# Patient Record
Sex: Female | Born: 1980 | Race: Black or African American | Hispanic: No | Marital: Single | Smoking: Former smoker
Health system: Southern US, Community
[De-identification: ages and names within clinical notes are randomized; demographics above are authoritative.]

## PROBLEM LIST (undated history)

## (undated) DIAGNOSIS — E282 Polycystic ovarian syndrome: Secondary | ICD-10-CM

## (undated) DIAGNOSIS — G43909 Migraine, unspecified, not intractable, without status migrainosus: Secondary | ICD-10-CM

## (undated) DIAGNOSIS — E119 Type 2 diabetes mellitus without complications: Secondary | ICD-10-CM

## (undated) DIAGNOSIS — G2581 Restless legs syndrome: Secondary | ICD-10-CM

## (undated) HISTORY — DX: Restless legs syndrome: G25.81

## (undated) HISTORY — DX: Type 2 diabetes mellitus without complications: E11.9

## (undated) HISTORY — PX: TONSILLECTOMY: SUR1361

## (undated) HISTORY — DX: Migraine, unspecified, not intractable, without status migrainosus: G43.909

---

## 2000-11-09 ENCOUNTER — Emergency Department (HOSPITAL_COMMUNITY): Admission: EM | Admit: 2000-11-09 | Discharge: 2000-11-09 | Payer: Self-pay | Admitting: Emergency Medicine

## 2001-03-13 ENCOUNTER — Other Ambulatory Visit: Admission: RE | Admit: 2001-03-13 | Discharge: 2001-03-13 | Payer: Self-pay | Admitting: Gynecology

## 2001-03-16 ENCOUNTER — Inpatient Hospital Stay (HOSPITAL_COMMUNITY): Admission: AD | Admit: 2001-03-16 | Discharge: 2001-03-16 | Payer: Self-pay | Admitting: Gynecology

## 2001-10-29 ENCOUNTER — Emergency Department (HOSPITAL_COMMUNITY): Admission: EM | Admit: 2001-10-29 | Discharge: 2001-10-30 | Payer: Self-pay | Admitting: Emergency Medicine

## 2002-04-12 ENCOUNTER — Emergency Department (HOSPITAL_COMMUNITY): Admission: EM | Admit: 2002-04-12 | Discharge: 2002-04-12 | Payer: Self-pay | Admitting: Emergency Medicine

## 2002-08-15 ENCOUNTER — Emergency Department (HOSPITAL_COMMUNITY): Admission: EM | Admit: 2002-08-15 | Discharge: 2002-08-15 | Payer: Self-pay | Admitting: Emergency Medicine

## 2002-11-19 ENCOUNTER — Emergency Department (HOSPITAL_COMMUNITY): Admission: EM | Admit: 2002-11-19 | Discharge: 2002-11-19 | Payer: Self-pay | Admitting: Emergency Medicine

## 2002-11-19 ENCOUNTER — Encounter: Payer: Self-pay | Admitting: Emergency Medicine

## 2003-03-12 ENCOUNTER — Inpatient Hospital Stay (HOSPITAL_COMMUNITY): Admission: AD | Admit: 2003-03-12 | Discharge: 2003-03-12 | Payer: Self-pay | Admitting: *Deleted

## 2003-03-19 ENCOUNTER — Emergency Department (HOSPITAL_COMMUNITY): Admission: EM | Admit: 2003-03-19 | Discharge: 2003-03-19 | Payer: Self-pay | Admitting: Emergency Medicine

## 2003-05-05 ENCOUNTER — Emergency Department (HOSPITAL_COMMUNITY): Admission: EM | Admit: 2003-05-05 | Discharge: 2003-05-05 | Payer: Self-pay | Admitting: Emergency Medicine

## 2004-12-22 ENCOUNTER — Other Ambulatory Visit: Admission: RE | Admit: 2004-12-22 | Discharge: 2004-12-22 | Payer: Self-pay | Admitting: Obstetrics and Gynecology

## 2005-06-13 ENCOUNTER — Inpatient Hospital Stay (HOSPITAL_COMMUNITY): Admission: AD | Admit: 2005-06-13 | Discharge: 2005-06-13 | Payer: Self-pay | Admitting: Obstetrics and Gynecology

## 2007-01-19 ENCOUNTER — Encounter: Admission: RE | Admit: 2007-01-19 | Discharge: 2007-01-19 | Payer: Self-pay | Admitting: Internal Medicine

## 2008-12-03 IMAGING — US US TRANSVAGINAL NON-OB
1 series · 14 of 25 positions shown · non-contrast
Comparison: None.

CLINICAL DATA: Irregular menstrual cycle since puberty.  
 TRANSABDOMINAL AND TRANSVAGINAL PELVIC ULTRASOUND:
TECHNIQUE: Both transabdominal and transvaginal ultrasound examinations of the pelvis were performed including evaluation of the uterus, ovaries, adnexal regions, and pelvic cul-de-sac.

[Series 1: unknown · 0.13mm/px · 14 of 35 slices shown]
[im 1/35]
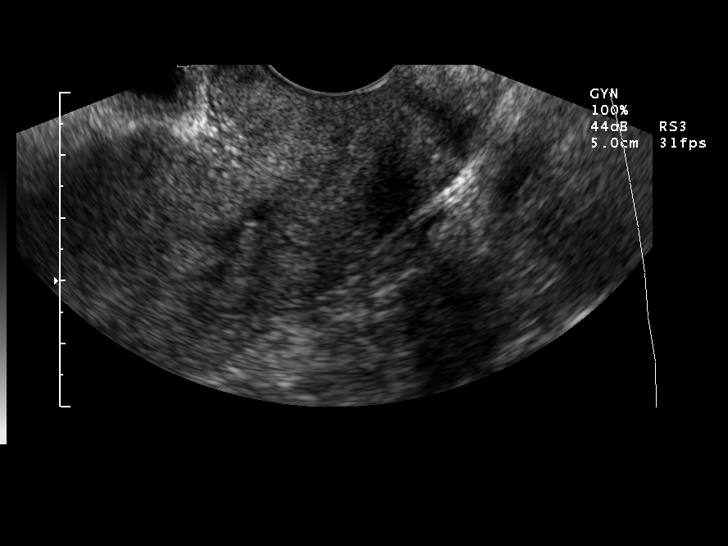
[im 3/35]
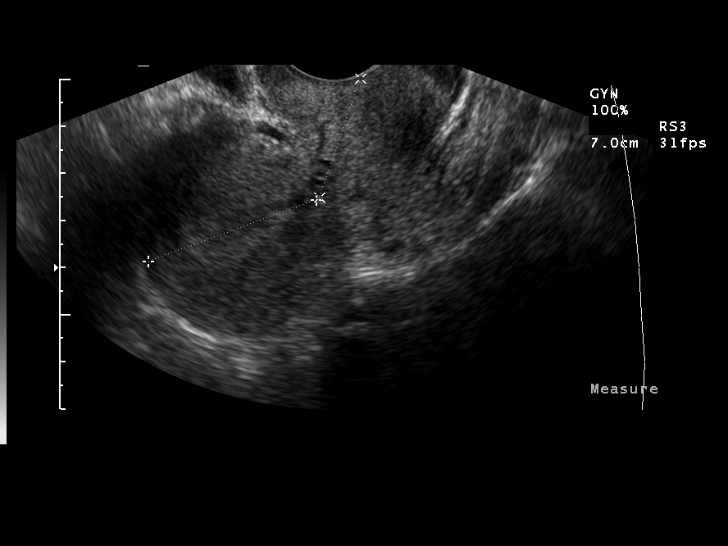
[im 6/35]
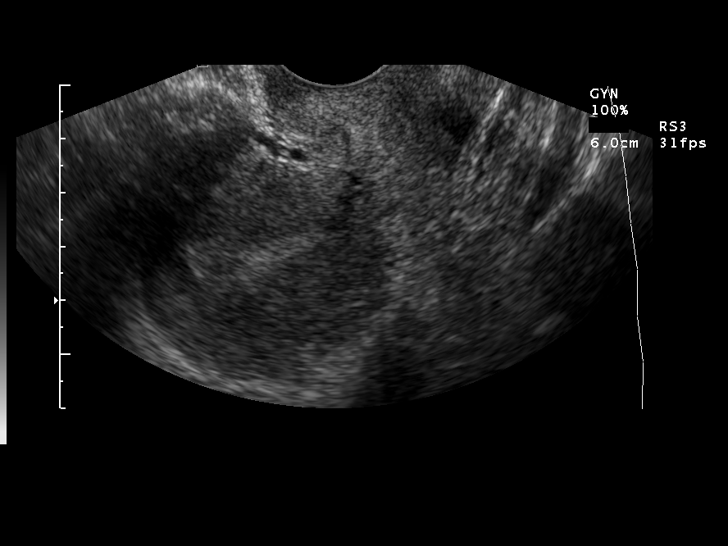
[im 9/35]
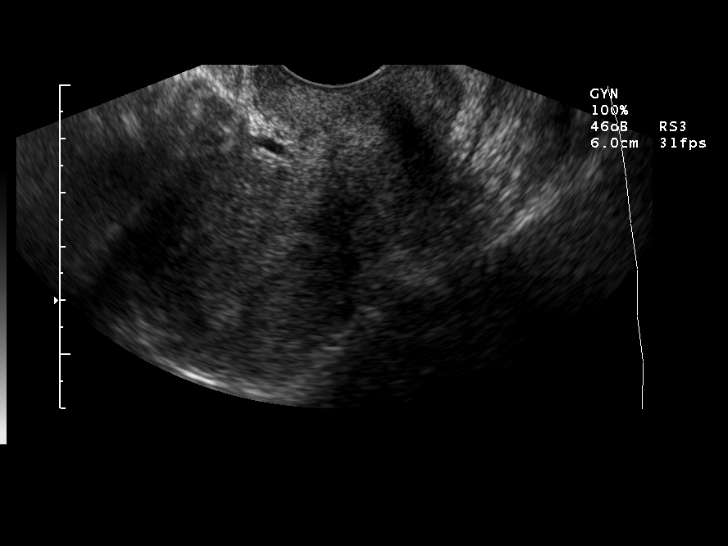
[im 12/35]
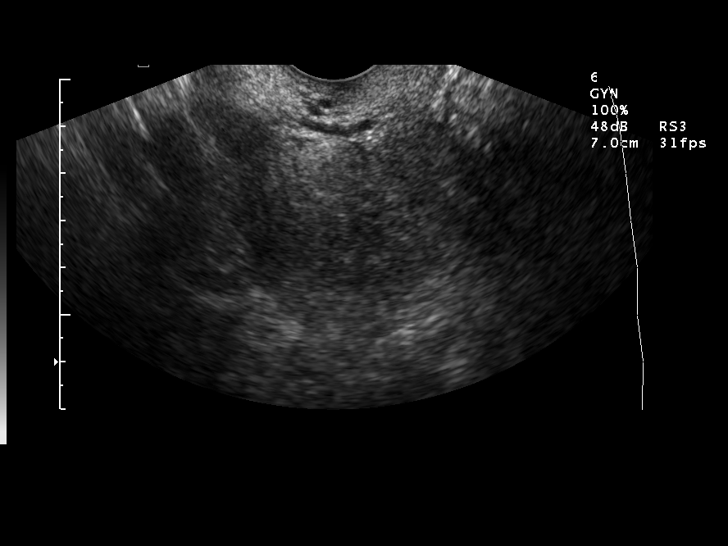
[im 13/35]
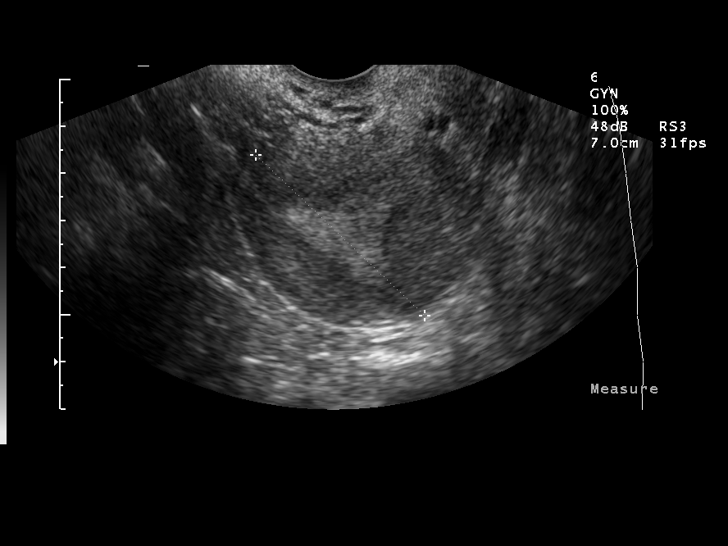
[im 16/35]
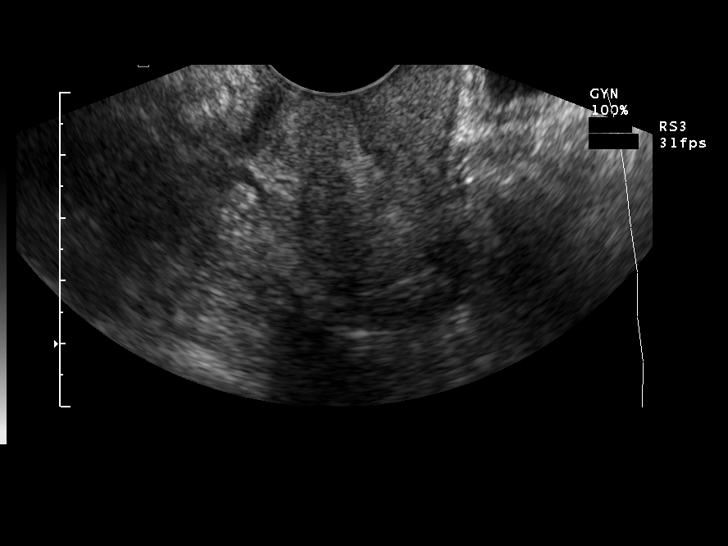
[im 19/35]
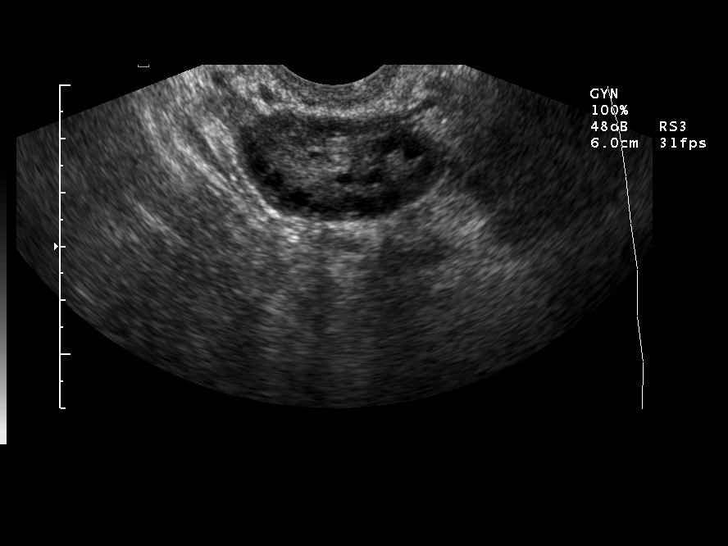
[im 22/35]
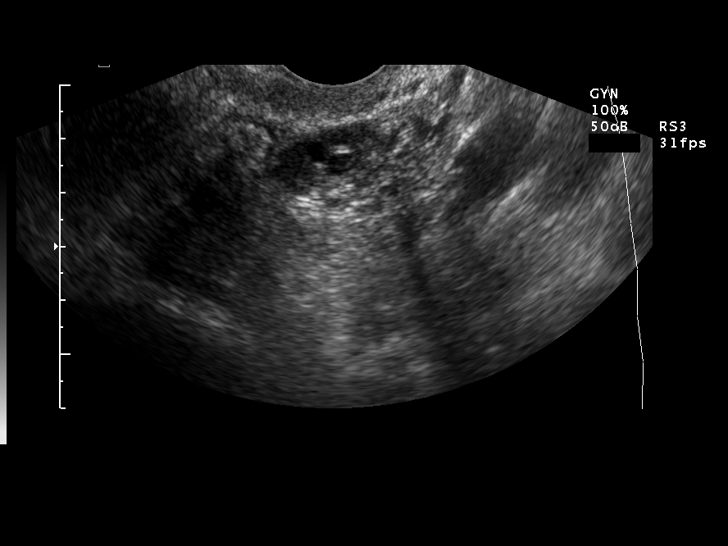
[im 23/35]
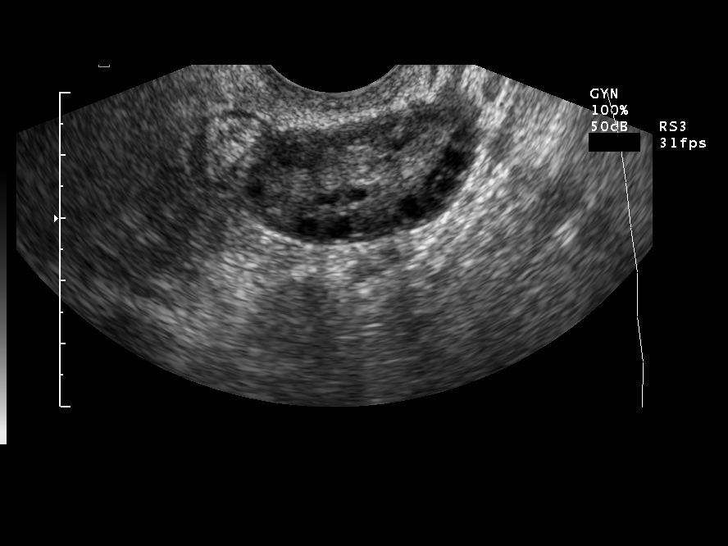
[im 26/35]
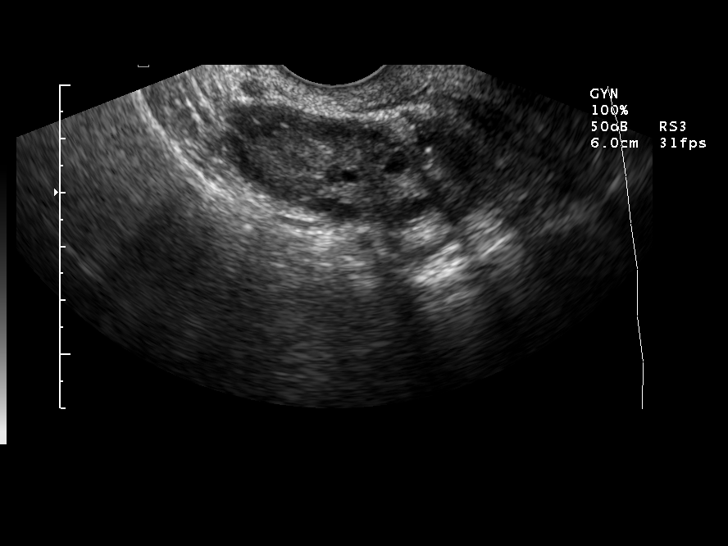
[im 29/35]
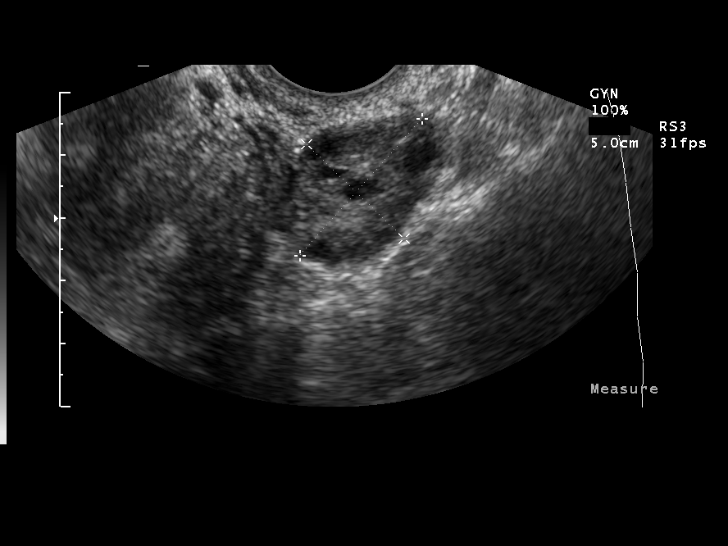
[im 32/35]
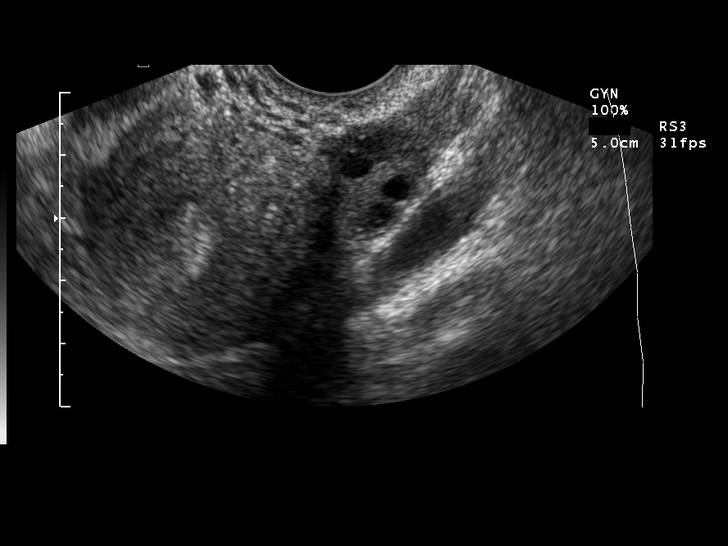
[im 35/35]
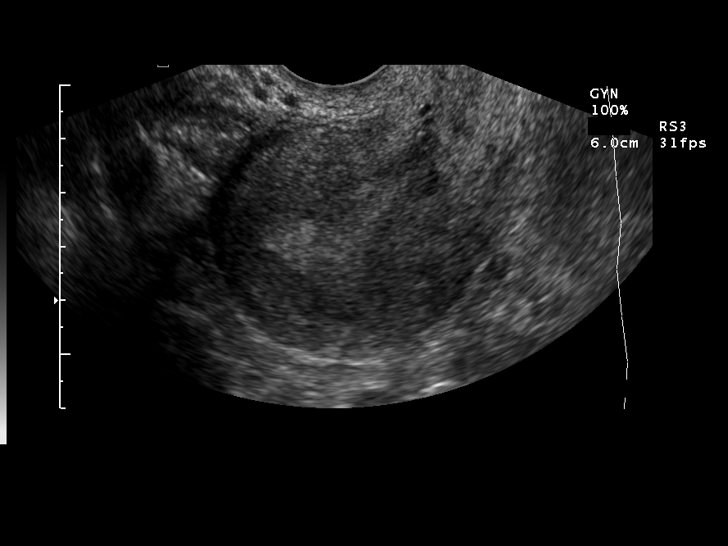

[14 of 25 positions shown; findings below may reference images not displayed]

FINDINGS: Uterus is normal in size measuring 7.5 cm long x 3.8 cm AP x 3.7 cm wide.  Fundal endometrial stripe thickness measures normally up to 10 mm for menstruating female.  No focal uterine lesions are seen.  Bilateral ovaries are normal in size with the right measuring 2.9 cm long x 2.2 cm AP x 4 cm wide and the left 3.6 cm long x 2 cm AP x 3.9 cm wide.  Subcentimeter bilateral ovarian follicles are noted.  No free fluid seen.
IMPRESSION: No significant abnormality.

## 2009-02-17 ENCOUNTER — Encounter: Admission: RE | Admit: 2009-02-17 | Discharge: 2009-05-18 | Payer: Self-pay | Admitting: Obstetrics and Gynecology

## 2011-03-22 ENCOUNTER — Other Ambulatory Visit (HOSPITAL_COMMUNITY)
Admission: RE | Admit: 2011-03-22 | Discharge: 2011-03-22 | Disposition: A | Discharge status: Home / Self Care | Payer: BC Managed Care – PPO | Source: Ambulatory Visit | Attending: Obstetrics and Gynecology | Admitting: Obstetrics and Gynecology

## 2011-03-22 ENCOUNTER — Other Ambulatory Visit: Payer: Self-pay | Admitting: Obstetrics and Gynecology

## 2011-03-22 ENCOUNTER — Other Ambulatory Visit: Payer: Self-pay

## 2011-03-22 DIAGNOSIS — Z113 Encounter for screening for infections with a predominantly sexual mode of transmission: Secondary | ICD-10-CM | POA: Insufficient documentation

## 2011-03-22 DIAGNOSIS — Z01419 Encounter for gynecological examination (general) (routine) without abnormal findings: Secondary | ICD-10-CM | POA: Insufficient documentation

## 2011-03-22 DIAGNOSIS — N63 Unspecified lump in unspecified breast: Secondary | ICD-10-CM

## 2011-03-25 ENCOUNTER — Other Ambulatory Visit: Payer: Self-pay

## 2011-03-25 ENCOUNTER — Other Ambulatory Visit: Payer: Self-pay | Admitting: Obstetrics and Gynecology

## 2011-03-25 ENCOUNTER — Ambulatory Visit
Admission: RE | Admit: 2011-03-25 | Discharge: 2011-03-25 | Disposition: A | Discharge status: Home / Self Care | Payer: BC Managed Care – PPO | Source: Ambulatory Visit | Attending: Obstetrics and Gynecology | Admitting: Obstetrics and Gynecology

## 2011-03-25 DIAGNOSIS — N63 Unspecified lump in unspecified breast: Secondary | ICD-10-CM

## 2011-10-14 ENCOUNTER — Ambulatory Visit (INDEPENDENT_AMBULATORY_CARE_PROVIDER_SITE_OTHER): Payer: BC Managed Care – PPO

## 2011-10-14 DIAGNOSIS — R509 Fever, unspecified: Secondary | ICD-10-CM

## 2011-10-14 DIAGNOSIS — H6120 Impacted cerumen, unspecified ear: Secondary | ICD-10-CM

## 2011-12-14 ENCOUNTER — Ambulatory Visit (INDEPENDENT_AMBULATORY_CARE_PROVIDER_SITE_OTHER): Payer: BC Managed Care – PPO | Admitting: Family Medicine

## 2011-12-14 VITALS — BP 116/74 | HR 80 | Temp 98.1°F | Resp 16 | Ht 61.5 in | Wt 242.0 lb

## 2011-12-14 DIAGNOSIS — M5412 Radiculopathy, cervical region: Secondary | ICD-10-CM

## 2011-12-14 DIAGNOSIS — M25519 Pain in unspecified shoulder: Secondary | ICD-10-CM

## 2011-12-14 NOTE — Progress Notes (Signed)
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 For 2 weeks he's had left neck and scapular pain that runs down her left arm posteriorly. Started without a known injury and has persisted. She has pain that is worsened when she raises her left arm this had no weakness. She can she's had a little bit of swelling in her left hand.  Objective: No acute distress,  Neurological exam intact with normal reflexes, sensation, motor exam of the left extremity  Examination of the back reveals tenderness at the insertion of the levator scapula.  Neck exam shows decreased range of motion with rotation head to the right.  Assessment: Cervical radiculopathy  Plan: Flexeril 10 mg each bedtime, Medrol 4 mg daily for 5 days.

## 2011-12-20 ENCOUNTER — Telehealth: Payer: Self-pay

## 2011-12-20 DIAGNOSIS — M79603 Pain in arm, unspecified: Secondary | ICD-10-CM

## 2011-12-20 NOTE — Telephone Encounter (Signed)
PT STATES SHE WAS TOLD TO CALL BACK IF ARM SYMPTOMS DID NOT CHANGE,  BEST PHONE [phone]   [facility] [address]

## 2011-12-23 ENCOUNTER — Other Ambulatory Visit: Payer: Self-pay | Admitting: Family Medicine

## 2011-12-23 DIAGNOSIS — M79601 Pain in right arm: Secondary | ICD-10-CM

## 2011-12-23 MED ORDER — CYCLOBENZAPRINE HCL 10 MG PO TABS: 10.0000 mg | ORAL_TABLET | Freq: Three times a day (TID) | ORAL | Status: AC | PRN

## 2011-12-23 NOTE — Telephone Encounter (Signed)
Pt CB and reported that her Sxs have not improved in any way, and are actually worse in some ways. The pain is a little worse in back area. The amount that she can lift her arm is about the same. She also feels a tightening in area that she didn't really have before, and there are two areas in her arm that are sore to touch when she pushes down on the areas. Pt describes the pain as an achiness but with occasional shooting pains from back down arm. She has finished the Medrol. The Flexeral helps her sleep at night when she wasn't bf, so that helps in that way. Please advise next step

## 2011-12-23 NOTE — Telephone Encounter (Signed)
Re-routing to PA pool since Dr L will not be back for a couple of days

## 2011-12-23 NOTE — Telephone Encounter (Signed)
Pt agrees to referral to any ortho you recommend. Also, can we get her a RF of Flexeril so she can sleep while waiting on referral? She states she has about 2 tabs left. [facility] [address]

## 2011-12-23 NOTE — Telephone Encounter (Signed)
lmom to cb. 

## 2011-12-23 NOTE — Telephone Encounter (Signed)
Sent meds to pharmacy - Dr Milus Glazier already did referral to ortho.

## 2011-12-23 NOTE — Telephone Encounter (Signed)
I am referring her to ortho.  This is a very unusual problem

## 2011-12-24 NOTE — Telephone Encounter (Signed)
Spoke with pt advised RX sent to pharmacy and referral made. Pt understood

## 2012-03-22 ENCOUNTER — Other Ambulatory Visit: Payer: Self-pay | Admitting: Obstetrics & Gynecology

## 2012-03-22 ENCOUNTER — Other Ambulatory Visit (HOSPITAL_COMMUNITY)
Admission: RE | Admit: 2012-03-22 | Discharge: 2012-03-22 | Disposition: A | Discharge status: Home / Self Care | Payer: BC Managed Care – PPO | Source: Ambulatory Visit | Attending: Obstetrics & Gynecology | Admitting: Obstetrics & Gynecology

## 2012-03-22 DIAGNOSIS — Z1151 Encounter for screening for human papillomavirus (HPV): Secondary | ICD-10-CM | POA: Insufficient documentation

## 2012-03-22 DIAGNOSIS — Z124 Encounter for screening for malignant neoplasm of cervix: Secondary | ICD-10-CM | POA: Insufficient documentation

## 2012-03-22 DIAGNOSIS — Z0000 Encounter for general adult medical examination without abnormal findings: Secondary | ICD-10-CM | POA: Insufficient documentation

## 2012-11-15 ENCOUNTER — Encounter: Payer: Self-pay | Admitting: Family Medicine

## 2012-11-15 ENCOUNTER — Ambulatory Visit (INDEPENDENT_AMBULATORY_CARE_PROVIDER_SITE_OTHER): Payer: BC Managed Care – PPO | Admitting: Family Medicine

## 2012-11-15 VITALS — BP 110/80 | HR 82 | Temp 98.1°F | Ht 61.0 in | Wt 248.0 lb

## 2012-11-15 DIAGNOSIS — G43909 Migraine, unspecified, not intractable, without status migrainosus: Secondary | ICD-10-CM

## 2012-11-15 LAB — CBC WITH DIFFERENTIAL/PLATELET
Basophils Absolute: 0 10*3/uL (ref 0.0–0.1)
HCT: 37 % (ref 36.0–46.0)
Hemoglobin: 12.5 g/dL (ref 12.0–15.0)
Lymphs Abs: 2.3 10*3/uL (ref 0.7–4.0)
MCHC: 33.8 g/dL (ref 30.0–36.0)
MCV: 84.1 fl (ref 78.0–100.0)
Monocytes Absolute: 0.4 10*3/uL (ref 0.1–1.0)
Monocytes Relative: 5.1 % (ref 3.0–12.0)
Neutro Abs: 4.9 10*3/uL (ref 1.4–7.7)
RDW: 15.7 % — ABNORMAL HIGH (ref 11.5–14.6)

## 2012-11-15 LAB — LIPID PANEL
Cholesterol: 206 mg/dL — ABNORMAL HIGH (ref 0–200)
Total CHOL/HDL Ratio: 4
Triglycerides: 69 mg/dL (ref 0.0–149.0)

## 2012-11-15 LAB — HEPATIC FUNCTION PANEL
Albumin: 3.8 g/dL (ref 3.5–5.2)
Bilirubin, Direct: 0 mg/dL (ref 0.0–0.3)
Total Protein: 7.9 g/dL (ref 6.0–8.3)

## 2012-11-15 LAB — BASIC METABOLIC PANEL
CO2: 26 mEq/L (ref 19–32)
Calcium: 8.8 mg/dL (ref 8.4–10.5)
Creatinine, Ser: 0.6 mg/dL (ref 0.4–1.2)
GFR: 140.57 mL/min (ref >60.00)

## 2012-11-15 MED ORDER — PROMETHAZINE HCL 25 MG PO TABS: 25.0000 mg | ORAL_TABLET | Freq: Three times a day (TID) | ORAL | Status: DC | PRN

## 2012-11-15 MED ORDER — SUMATRIPTAN SUCCINATE 100 MG PO TABS: 100.0000 mg | ORAL_TABLET | ORAL | Status: DC | PRN

## 2012-11-15 NOTE — Progress Notes (Signed)
  Subjective:    Patient ID: Danielle Glenn, female    DOB: [birth year], 32 y.o.   MRN: [id number]  HPI New to establish.  Previous MD- [facility]  GYN- [facility], [place].  UTD on pap.  HAs- will last 3-4 days.  Has previously taken Tramadol w/ partial relief.  Typically unilateral but can spread to bilateral.  HAs are frontal.  Pain is 'real sharp'.  + nausea, no vomiting.  Has taken phenergan previously.  Will have blurry vision preceding HA.  + phonophobia.  Mild photophobia.  + family hx of migraines.  Has never taken migraine med.  Obesity- chronic problem but pt reports this is the heaviest she's been.  18 months ago pt was 200 lbs.  Pt trying to monitor carbs and portion size.  Starting to exercise.   Review of Systems For ROS see HPI     Objective:   Physical Exam  Vitals reviewed. Constitutional: She is oriented to person, place, and time. She appears well-developed and well-nourished. No distress.  Morbidly obese  HENT:  Head: Normocephalic and atraumatic.  TMs WNL No TTP over sinuses Minimal nasal congestion  Eyes: Conjunctivae and EOM are normal. Pupils are equal, round, and reactive to light.  Neck: Normal range of motion. Neck supple.  Cardiovascular: Normal rate, regular rhythm, normal heart sounds and intact distal pulses.   Pulmonary/Chest: Effort normal and breath sounds normal. No respiratory distress. She has no wheezes. She has no rales.  Lymphadenopathy:    She has no cervical adenopathy.  Neurological: She is alert and oriented to person, place, and time. She has normal reflexes. No cranial nerve deficit. Coordination normal.  Psychiatric: She has a normal mood and affect. Her behavior is normal. Judgment and thought content normal.          Assessment & Plan:

## 2012-11-15 NOTE — Patient Instructions (Addendum)
Follow up in 6 weeks to recheck headaches and weight loss We'll notify you of your lab results and make any changes if needed Try and schedule your exercise the same way you would schedule an appt- goal is 30 minutes 4-5x/week Limit your carbs, don't drink your calories, use smaller plates, a glass of cold water prior to meals Take the Excedrin Migraine as soon as you feel a bad headache coming If no improvement in 30-45 minutes, take 1/2 tab of imitrex.  Repeat the imitrex in 2 hrs if needed Use the phenergan as needed for nausea Call with any questions or concerns Welcome!  We're glad to have you!!!

## 2012-11-17 ENCOUNTER — Encounter: Payer: Self-pay | Admitting: *Deleted

## 2012-11-26 NOTE — Assessment & Plan Note (Signed)
New to provider, chronic for pt.  Start triptan and phenergan prn.  Reviewed HA tx plan.  Will follow.

## 2012-11-26 NOTE — Assessment & Plan Note (Signed)
New.  Stressed importance of healthy diet and regular activity.  Reviewed diet plan options and the strengths and weaknesses of each.  Check labs to risk stratify.  Will follow closely.

## 2012-12-29 ENCOUNTER — Ambulatory Visit (INDEPENDENT_AMBULATORY_CARE_PROVIDER_SITE_OTHER): Payer: BC Managed Care – PPO | Admitting: Family Medicine

## 2012-12-29 ENCOUNTER — Encounter: Payer: Self-pay | Admitting: Family Medicine

## 2012-12-29 VITALS — BP 130/80 | HR 95 | Temp 97.8°F | Ht 61.0 in | Wt 254.0 lb

## 2012-12-29 DIAGNOSIS — R6889 Other general symptoms and signs: Secondary | ICD-10-CM

## 2012-12-29 DIAGNOSIS — G43909 Migraine, unspecified, not intractable, without status migrainosus: Secondary | ICD-10-CM

## 2012-12-29 DIAGNOSIS — R7989 Other specified abnormal findings of blood chemistry: Secondary | ICD-10-CM

## 2012-12-29 LAB — T3, FREE: T3, Free: 2.9 pg/mL (ref 2.3–4.2)

## 2012-12-29 LAB — T4, FREE: Free T4: 1.03 ng/dL (ref 0.60–1.60)

## 2012-12-29 NOTE — Assessment & Plan Note (Signed)
New.  Recheck labs- including free T3/T4.  Pt's slightly low TSH last time may be representing an impending thyroid burnout.  Will tx hypothyroid if present.  if TSH is again low, will refer to endo

## 2012-12-29 NOTE — Assessment & Plan Note (Signed)
Improved.  Pt feels OCPs were triggering HAs.  Will continue to follow.

## 2012-12-29 NOTE — Assessment & Plan Note (Signed)
Unchanged.  Despite healthy diet, regular exercise and working w/ Systems analyst pt continues to gain weight.  If thyroid fxn is normal, will need to refer to endo for complete w/u.  Pt expressed understanding and is in agreement w/ plan.

## 2012-12-29 NOTE — Addendum Note (Signed)
Addended by: [name] on: 12/29/2012 04:38 PM   Modules accepted: Orders

## 2012-12-29 NOTE — Patient Instructions (Addendum)
We'll call you with your lab results The next step will be endocrinology to see what's going on Keep up the good work!  Don't quit on me! Hang in there!

## 2012-12-29 NOTE — Progress Notes (Signed)
  Subjective:    Patient ID: Danielle Glenn, female    DOB: August 02, 1981, 32 y.o.   MRN: [id number]  HPI Migraines- HAs have improved since stopping OCPs.  No HAs last week, 1 HA the week prior.  This is huge improvement from previous.  When she does get a HA she states it's not migrainous in nature- no N/V, visual changes.  Obesity- now has training, is working out, reports she is now eating right.  Trainer is concerned about lack of progress.  Is 'extremely tired'.  Had abnormal TSH at last check.  Pt is possibly PCOS- hirsute, heavy irregular periods.   Review of Systems For ROS see HPI     Objective:   Physical Exam  Vitals reviewed. Constitutional: She is oriented to person, place, and time. She appears well-developed and well-nourished. No distress.  Morbidly obese  HENT:  Head: Normocephalic and atraumatic.  Eyes: Conjunctivae and EOM are normal. Pupils are equal, round, and reactive to light.  Neck: Normal range of motion. Neck supple. No thyromegaly present.  Cardiovascular: Normal rate, regular rhythm, normal heart sounds and intact distal pulses.   No murmur heard. Pulmonary/Chest: Effort normal and breath sounds normal. No respiratory distress.  Abdominal: Soft. She exhibits no distension. There is no tenderness.  Musculoskeletal: She exhibits no edema.  Lymphadenopathy:    She has no cervical adenopathy.  Neurological: She is alert and oriented to person, place, and time.  Skin: Skin is warm and dry.  Hirsute  Psychiatric: She has a normal mood and affect. Her behavior is normal.          Assessment & Plan:

## 2013-01-17 ENCOUNTER — Encounter: Payer: Self-pay | Admitting: Family Medicine

## 2013-01-25 ENCOUNTER — Ambulatory Visit: Payer: BC Managed Care – PPO | Admitting: Internal Medicine

## 2013-02-06 ENCOUNTER — Ambulatory Visit: Payer: BC Managed Care – PPO | Admitting: Internal Medicine

## 2013-02-06 DIAGNOSIS — Z0289 Encounter for other administrative examinations: Secondary | ICD-10-CM

## 2013-02-16 ENCOUNTER — Ambulatory Visit: Payer: BC Managed Care – PPO | Admitting: Family Medicine

## 2013-02-21 ENCOUNTER — Ambulatory Visit: Payer: BC Managed Care – PPO | Admitting: Family Medicine

## 2013-02-26 ENCOUNTER — Ambulatory Visit: Payer: BC Managed Care – PPO | Admitting: Family Medicine

## 2013-02-27 ENCOUNTER — Encounter: Payer: Self-pay | Admitting: Internal Medicine

## 2013-02-27 ENCOUNTER — Ambulatory Visit (INDEPENDENT_AMBULATORY_CARE_PROVIDER_SITE_OTHER): Payer: BC Managed Care – PPO | Admitting: Internal Medicine

## 2013-02-27 VITALS — BP 118/74 | HR 108 | Temp 98.5°F | Resp 10 | Ht 61.0 in | Wt 254.0 lb

## 2013-02-27 DIAGNOSIS — E282 Polycystic ovarian syndrome: Secondary | ICD-10-CM

## 2013-02-27 DIAGNOSIS — R6889 Other general symptoms and signs: Secondary | ICD-10-CM

## 2013-02-27 DIAGNOSIS — R7989 Other specified abnormal findings of blood chemistry: Secondary | ICD-10-CM

## 2013-02-27 DIAGNOSIS — R635 Abnormal weight gain: Secondary | ICD-10-CM

## 2013-02-27 DIAGNOSIS — L680 Hirsutism: Secondary | ICD-10-CM

## 2013-02-27 DIAGNOSIS — N926 Irregular menstruation, unspecified: Secondary | ICD-10-CM

## 2013-02-27 LAB — T3, FREE: T3, Free: 2.8 pg/mL (ref 2.3–4.2)

## 2013-02-27 LAB — LUTEINIZING HORMONE: LH: 18.72 m[IU]/mL

## 2013-02-27 LAB — FOLLICLE STIMULATING HORMONE: FSH: 7.4 m[IU]/mL

## 2013-02-27 NOTE — Progress Notes (Signed)
Subjective:     Patient ID: Danielle Glenn, female   DOB: 25-Dec-1980, 32 y.o.   MRN: [id number]  HPI Ms. Charley is a pleasant 32 year old woman, referred by her PCP, Dr. [name], for possible diagnosis of PCOS.  The patient has been seen at [facility] clinic, U/S 's of her ovaries >> concern for PCO S., but was not given a diagnosis of the syndrome. I reviewed one of the transvaginal ultrasound reports from 01/19/2007, in which it was mentioned that ovaries contains subcentimeter bilateral ovarian follicles.  She has irregular menstrual cycles, and tells me that she can go a whole year w/o menstrual cycles. Menarche was at 37, she had regular cycles until ~32 y/o, but they started to become abnormal then, therefore she was started on OCP to normalize them. She has had increased pain with her menstrual cycles (and 2 weeks before): back and leg pain. She has been on and off OCP, last course was last year, but did not feel that they helped, and she continued to have pain with her menstrual cycles when she was on them. She cannot remember the name of the OCP that she was on. LMP 09/2012. She has had cramping since then but did not have bleeding. She did not try to become pregnant in the past and does not have any children.  Patient also complains of hirsutism: she has dark, coarse, hair shafts on sideburns, moustache, chin, abdomen. This is not new. She is waxing. She has acne, but not bothering her lot. This is on lower face and upper back.   She has had problems with weight gain for a long time. She tells me that she can lose weight (she was at 200 lbs 2 years ago), but now she is the heaviest she has ever been. At the beginning of the year, she was eating right and worked out 5x a week with a Psychologist, educational. Since she was not losing weight, her trainer was also alarmed undoes when she started to investigate more into her weight problems. She is still walking, going to the gym, watching what she is eating.    She has thyroid ds FH in mother, aunt and MGM. Mother has problems with her weight, too. Mother had infertility problems at the time when she got pregnant with the pt.  She has HAs. She is working with Dr. [name] on this. No breast d/c.   Review of Systems Constitutional: fatigue, weight gain, poor sleep, no subjective hyperthermia/hypothermia Eyes:occasional blurry vision, no xerophthalmia ENT: no sore throat, no nodules palpated in throat, no dysphagia/odynophagia, no hoarseness Cardiovascular: no CP/SOB/palpitations/+ leg swelling Respiratory: no cough/SOB Gastrointestinal: no N/V/D/C Musculoskeletal: no muscle/joint aches Skin: no rashes, + excessive hair growth Neurological: no tremors/numbness/tingling/dizziness, + HA Psychiatric: no depression/anxiety  Past Medical History  Diagnosis Date  . Migraines    Past Surgical History  Procedure Laterality Date  . Tonsillectomy     History   Social History  . Marital Status: Single    Spouse Name: N/A    Number of Children: 0   Occupational History  . Health coordinator   Social History Main Topics  . Smoking status: Not a regular smoker, but quit this year  . Smokeless tobacco: No  . Alcohol Use: Social drinker  . Drug Use: No   Current Outpatient Prescriptions on File Prior to Visit  Medication Sig Dispense Refill  . promethazine (PHENERGAN) 25 MG tablet Take 1 tablet (25 mg total) by mouth every 8 (eight) hours as  needed for nausea.  20 tablet  0  . SUMAtriptan (IMITREX) 100 MG tablet Take 1 tablet (100 mg total) by mouth every 2 (two) hours as needed for migraine.  10 tablet  0   No current facility-administered medications on file prior to visit.   Allergies  Allergen Reactions  . Penicillins Hives   Family History  Problem Relation Age of Onset  . Lupus Mother   . Hypothyroidism Mother   . Hypertension Mother   . Heart disease Father   . Hypertension Father   . Hypertension Brother   . Diabetes  Maternal Aunt   . Diabetes Maternal Grandmother   . Heart disease Paternal Grandfather    Objective:   Physical Exam BP 118/74  Pulse 108  Temp(Src) 98.5 F (36.9 C) (Oral)  Resp 10  Ht 5\' 1"  (1.549 m)  Wt 254 lb (115.214 kg)  BMI 48.02 kg/m2  SpO2 98%  LMP 10/26/2012 Wt Readings from Last 3 Encounters:  02/27/13 254 lb (115.214 kg)  12/29/12 254 lb (115.214 kg)  11/15/12 248 lb (112.492 kg)   Constitutional: overweight, in NAD Eyes: PERRLA, EOMI, no exophthalmos ENT: moist mucous membranes, no thyromegaly, no cervical lymphadenopathy Cardiovascular: RRR, No MRG Respiratory: CTA B Gastrointestinal: abdomen soft, NT, ND, BS+ Musculoskeletal: no deformities, strength intact in all 4 Skin: moist, warm; dark, coarse, hairs on chin and up on the side burns. Acne scars on chin and upper back. Acanthosis nigricans on neck Neurological: no tremor with outstretched hands, DTR normal in all 4    Assessment:  1. ? PCOS - hirsutism, acne - Irregular menstrual cycles - Weight gain    Plan:     1. Patient with history of obesity, and ovarian cysts, without a clear diagnosis of PCO S. She mentions that she has acne and hirsutism, which are substantiated on today's physical exam, and she also has irregular menstrual cycles. - we discussed about the PCOS diagnosis, which is a misnomer. If she has evidence of clinical hyperandrogenism, which she has, and either irregular menstrual cycles or polycystic ovaries on ultrasound, and she definitely has the first.  - we discussed about the fact that the treatment for PCOS actually addresses the signs and symptoms that bothers the patient most. At this time, she is bothered by acne, hirsutism, weight gain, and her irregular menstrual cycles. We discussed about the possibility of starting an oral contraceptive, which we can definitely do, however I would be very tempted to start with metformin to help with her weight, to decrease her insulin  resistance, and hopefully to help with hirsutism. I explained that this is not a first line in diagnosis of PCO S., however, say she has been on oral contraceptives in the past we might try to start with metformin and then see if we still need the OCPs. I explained that we usually in for at least 4 menstrual cycles a year, to maintain the health of her bones. - we also discussed about the fact that PCOS is a condition predisposing to diabetes later on. Her hemoglobin A1c was 5.8% on 11/15/2012, which is  at the threshold for prediabetes. - I would like to rule out hyperprolactinemia, hypothyroidism, and NCCAH.  - I will send the patient there is also through my chart  - if the labs are abnormal, I will see her back in 4 months  Office Visit on 02/27/2013  Component Date Value Range Status  . hCG, Beta Chain, Quant, S 02/27/2013 0.90  Final   0 - 1 Weeks  5 - 501 - 2 Weeks  50 - 5002 - 3 Weeks  100 - 5,0003 - 4 Weeks  500 - 10,0004 - 5 Weeks  1,000 - 50,0005 - 6 Weeks  10,000 - 100,0006 - 8 Weeks  15,000 - 200,0008 - 12 Weeks  10,000 - 100,0000   . [facility] 02/27/2013 18.72   Final   Comment: Female Reference Range:20-70 yrs     1.5-9.3 mIU/mL>70 yrs       3.1-35.6 mIU/mLFemale Reference Range:Follicular Phase     1.9-12.5 mIU/mLMidcycle             8.7-76.3 mIU/mLLuteal Phase         0.5-16.9 mIU/mL  Post Menopausal      15.9-54.0                           mIU/mLPregnant             <1.5 mIU/mLContraceptives       0.7-5.6 mIU/mL  . T3, Free 02/27/2013 2.8  2.3 - 4.2 pg/mL Final  . Free T4 02/27/2013 0.95  0.60 - 1.60 ng/dL Final  . TSH 95/62/1308 0.53  0.35 - 5.50 uIU/mL Final  . [facility] 02/27/2013 7.4   Final   Female Reference Range:  1.4-18.1 mIU/mLFemale Reference Range:Follicular Phase          2.5-10.2 mIU/mLMidCycle Peak          3.4-33.4 mIU/mLLuteal Phase          1.5-9.1 mIU/mLPost Menopausal     23.0-116.3 mIU/mLPregnant          <0.3 mIU/mL  . Androstenedione 02/27/2013 158   Final   Comment:                             Adult Female Reference Ranges for                            Androstenedione, Serum:                                                        Follicular Phase:      35-250 ng/dL                             Luteal Phase:          30-235 ng/dL                             Postmenopausal Phase:  20-75  ng/dL   Not all labs were resulted in Epic by [facility] lab - we had to call and have them fax the rest of the results (estradiol is pending): - Total testosterone 59 (10-70); free testosterone 11.7 (0.6-6.8), SHBG 28 (18-114) - Prolactin 5.7 (2.8-29.2) - 17 hydroxyprogesterone 48 (depending on the phase of the menstrual cycle, follicular phase less or equal 185, luteal phase less or equal to 185) - Estradiol 46/ free E2 0.93 - normal  Based on the above labs, she most likely has PCOS. D/w her through MyChart and she would like to  try Metformin. Her ObGyn added Provera and gave her Nuvaring to try. I will see her back in 4 months.

## 2013-02-27 NOTE — Patient Instructions (Signed)
Please join MyChart. Please return in 4 months.

## 2013-02-28 LAB — PROLACTIN: Prolactin: 5.7 ng/mL

## 2013-03-01 ENCOUNTER — Encounter: Payer: Self-pay | Admitting: Family Medicine

## 2013-03-01 ENCOUNTER — Ambulatory Visit (INDEPENDENT_AMBULATORY_CARE_PROVIDER_SITE_OTHER): Payer: BC Managed Care – PPO | Admitting: Family Medicine

## 2013-03-01 VITALS — BP 120/70 | HR 105 | Temp 98.1°F | Ht 61.0 in | Wt 253.8 lb

## 2013-03-01 DIAGNOSIS — M7989 Other specified soft tissue disorders: Secondary | ICD-10-CM

## 2013-03-01 NOTE — Progress Notes (Signed)
  Subjective:    Patient ID: Danielle Glenn, female    DOB: 13-Sep-1981, 32 y.o.   MRN: [id number]  HPI L foot swelling- sxs started at least a month ago.  Tight feeling when moving toes.  Not painful.  Swelling was worse previously.  Today mildly swollen.  No injury.  No previous injury or surgery on L side.  No sxs on R side.  No swelling of hands.  No change in meds.  No CP, SOB.   Review of Systems For ROS see HPI     Objective:   Physical Exam  Vitals reviewed. Constitutional: She is oriented to person, place, and time. She appears well-developed and well-nourished. No distress.  HENT:  Head: Normocephalic and atraumatic.  Eyes: Conjunctivae and EOM are normal. Pupils are equal, round, and reactive to light.  Neck: Normal range of motion. Neck supple. No thyromegaly present.  Cardiovascular: Normal rate, regular rhythm, normal heart sounds and intact distal pulses.   No murmur heard. Pulmonary/Chest: Effort normal and breath sounds normal. No respiratory distress.  Abdominal: Soft. She exhibits no distension. There is no tenderness.  Musculoskeletal: She exhibits edema (trace edema on dorsum of L foot).  Lymphadenopathy:    She has no cervical adenopathy.  Neurological: She is alert and oriented to person, place, and time.  Skin: Skin is warm and dry.  Psychiatric: She has a normal mood and affect. Her behavior is normal.          Assessment & Plan:

## 2013-03-01 NOTE — Patient Instructions (Addendum)
Make sure you are drinking plenty of fluids Wear cushioned, supportive shoes Elevate your feet when swollen Try and limit the salt in your diet Call with any questions or concerns Have a great summer!!!

## 2013-03-04 NOTE — Assessment & Plan Note (Addendum)
New.  No obvious cause.  Minimal evidence on exam today.  Suspect this is multifactorial- increased salt intake, heat, improper footwear.  Reviewed lifestyle modifications.  Will follow.

## 2013-03-09 ENCOUNTER — Encounter: Payer: Self-pay | Admitting: Internal Medicine

## 2013-03-13 ENCOUNTER — Encounter: Payer: Self-pay | Admitting: Internal Medicine

## 2013-03-19 ENCOUNTER — Other Ambulatory Visit (HOSPITAL_COMMUNITY)
Admission: RE | Admit: 2013-03-19 | Discharge: 2013-03-19 | Disposition: A | Discharge status: Home / Self Care | Payer: BC Managed Care – PPO | Source: Ambulatory Visit | Attending: Obstetrics and Gynecology | Admitting: Obstetrics and Gynecology

## 2013-03-19 ENCOUNTER — Other Ambulatory Visit: Payer: Self-pay | Admitting: Obstetrics and Gynecology

## 2013-03-19 DIAGNOSIS — Z01419 Encounter for gynecological examination (general) (routine) without abnormal findings: Secondary | ICD-10-CM | POA: Insufficient documentation

## 2013-03-19 DIAGNOSIS — Z113 Encounter for screening for infections with a predominantly sexual mode of transmission: Secondary | ICD-10-CM | POA: Insufficient documentation

## 2013-03-20 ENCOUNTER — Telehealth: Payer: Self-pay | Admitting: *Deleted

## 2013-03-20 NOTE — Telephone Encounter (Signed)
[employer] and labs are still in process. Be advised.

## 2013-03-21 LAB — ESTRADIOL, FREE
Estradiol, Free: 0.93 pg/mL
Estradiol: 46 pg/mL

## 2013-03-23 ENCOUNTER — Encounter: Payer: Self-pay | Admitting: Internal Medicine

## 2013-03-23 DIAGNOSIS — E282 Polycystic ovarian syndrome: Secondary | ICD-10-CM | POA: Insufficient documentation

## 2013-03-23 MED ORDER — METFORMIN HCL 500 MG PO TABS: 500.0000 mg | ORAL_TABLET | Freq: Two times a day (BID) | ORAL | Status: DC

## 2013-03-28 NOTE — Telephone Encounter (Signed)
Estrodial has resulted. They are in EPIC.

## 2013-04-02 ENCOUNTER — Telehealth: Payer: Self-pay | Admitting: *Deleted

## 2013-04-02 ENCOUNTER — Encounter: Payer: Self-pay | Admitting: Family Medicine

## 2013-04-02 NOTE — Telephone Encounter (Signed)
Spoke with the pt and asked her if she has an eye doctor.  Pt stated that she does have a eye doctor.  Informed her that Dr. [name] stated that she should go and see her eye doctor regarding her eye problem.  Pt agreed.//AB/CMA

## 2013-04-02 NOTE — Telephone Encounter (Signed)
LM @ (8:32am) asking the pt to RTC regarding MyChart message regarding eye.//AB/CMA

## 2013-07-03 ENCOUNTER — Ambulatory Visit: Payer: BC Managed Care – PPO | Admitting: Internal Medicine

## 2013-07-03 DIAGNOSIS — Z0289 Encounter for other administrative examinations: Secondary | ICD-10-CM

## 2013-07-13 ENCOUNTER — Encounter: Payer: Self-pay | Admitting: Internal Medicine

## 2013-07-13 ENCOUNTER — Ambulatory Visit (INDEPENDENT_AMBULATORY_CARE_PROVIDER_SITE_OTHER): Payer: BC Managed Care – PPO | Admitting: Internal Medicine

## 2013-07-13 ENCOUNTER — Ambulatory Visit: Payer: BC Managed Care – PPO | Admitting: Internal Medicine

## 2013-07-13 VITALS — BP 118/68 | HR 113 | Temp 97.9°F | Resp 10 | Wt 258.0 lb

## 2013-07-13 DIAGNOSIS — E282 Polycystic ovarian syndrome: Secondary | ICD-10-CM

## 2013-07-13 MED ORDER — NORGESTIMATE-ETH ESTRADIOL 0.25-35 MG-MCG PO TABS: 1.0000 | ORAL_TABLET | Freq: Every day | ORAL | Status: DC

## 2013-07-13 NOTE — Patient Instructions (Addendum)
Try to go online to [email or web address] You will be called with the appointment to nutrition. Try Glumetza at 500 mg 2x a day with a meal, then increase to 1000 mg 2x a day if you can tolerate it. Please call if you can tolerate it and need me to send some to your pharmacy. Start OrthoCyclen on the first day of your menstrual cycle.

## 2013-07-13 NOTE — Progress Notes (Signed)
Subjective:     Patient ID: Danielle Glenn, female   DOB: Feb 23, 1981, 32 y.o.   MRN: [id number]  HPI Danielle Glenn is a pleasant 32 year old woman, returning for f/u for PCOS. Last visit 4 mo ago.  Reviewed hx: The patient has been seen at [facility] clinic, U/S 's of her ovaries >> concern for PCO S., but was not given a diagnosis of the syndrome. I reviewed one of the transvaginal ultrasound reports from 01/19/2007, in which it was mentioned that ovaries contains subcentimeter bilateral ovarian follicles. She has irregular menstrual cycles, can go a whole year w/o menstrual cycles. She had regular cycles until ~32 y/o, but they started to become abnormal then, therefore she was started on OCP to normalize them. She has had increased pain with her menstrual cycles (and 2 weeks before): back and leg pain. She has been on and off OCP, last course was in 2013, but did not feel that they helped, and she continued to have pain with her menstrual cycles when she was on them. She cannot remember the name of the OCP that she was on. LMP 09/2012. She has had cramping since then but did not have bleeding. She did not try to become pregnant in the past and does not have any children. Pt also has hirsutism (dark, coarse, hair shafts on sideburns, upper lip, chin, abdomen). She is waxing.  She has acne, but not bothering her lot. This is on lower face and upper back.  She has had problems with weight gain for a long time. Struggles with her weight (she was at 200 lbs 2 years ago). At the beginning of the year, she was eating right and worked out 5x a week with a Psychologist, educational, w/o results.   She is still walking, going to the gym and works with a trainer 3x  week, and is watching what she is eating >> still gained 5 lbs since last visit.  At last visit, we confirmed that she had PCOS and r/o hyperprolactinemia, hypothyroidism, and NCCAH.   We started Metformin after dx but she did not tolerate it well 2/2 diarrhea:  took it for 2 months, then stopped, then restarted 2 weeks ago.   She started Provera per ObGyn x 10 days >> brought on her menses - she had a total of 3 menstrual cycles this year.   She was also given NuvaRing by ObGyn >> could not fit it in the last time she tried >> stopped this.  Review of Systems Constitutional: + fatigue, + weight gain, no subjective hyperthermia/hypothermia, + poor sleep Eyes:occasional blurry vision, no xerophthalmia ENT: no sore throat, no nodules palpated in throat, no dysphagia/odynophagia, no hoarseness Cardiovascular: no CP/SOB/palpitations/+ leg swelling Respiratory: no cough/SOB Gastrointestinal: no N/V/D/C Musculoskeletal: no muscle/joint aches Skin: no rashes, + excessive hair growth Neurological: no tremors/numbness/tingling/dizziness, + HA Psychiatric: no depression/anxiety  I reviewed pt's medications, allergies, PMH, social hx, family hx and no changes required, except as mentioned above.  Objective:   Physical Exam BP 118/68  Pulse 113  Temp(Src) 97.9 F (36.6 C) (Oral)  Resp 10  Wt 258 lb (117.028 kg)  BMI 48.77 kg/m2  SpO2 97% Wt Readings from Last 3 Encounters:  07/13/13 258 lb (117.028 kg)  03/01/13 253 lb 12.8 oz (115.123 kg)  02/27/13 254 lb (115.214 kg)   Constitutional: obese, in NAD Eyes: PERRLA, EOMI, no exophthalmos ENT: moist mucous membranes, no thyromegaly, no cervical lymphadenopathy Cardiovascular: RRR, No MRG Respiratory: CTA B Gastrointestinal: abdomen soft, NT,  ND, BS+ Musculoskeletal: no deformities, strength intact in all 4 Skin: moist, warm; dark, coarse, hairs on chin and up on the side burns. Acne scars on chin and upper back. Acanthosis nigricans on neck Neurological: no tremor with outstretched hands, DTR normal in all 4    Assessment:  1. PCOS - hirsutism, acne - Irregular menstrual cycles - obesity  Labs: 11/15/2012:  - hemoglobin A1c 5.8%  02/27/2013:  - beta hCG: 0.90 - LH 18.72, FSH 7.4 -  TSH 0.53, free t3 2.8, Free T4 0.95 - Total testosterone 59 (10-70); free testosterone 11.7 (0.6-6.8), SHBG 28 (18-114) - Androstenedione 158 - Prolactin 5.7 (2.8-29.2) - 17 hydroxyprogesterone 48 - Estradiol 46/ free E2 0.93   2. Obesity class III BMI Classification:  < 18.5 underweight   18.5-24.9 normal weight   25.0-29.9 overweight   30.0-34.9 class I obesity   35.0-39.9 class II obesity   ? 40.0 class III obesity      Plan:     1. PCOS Patient with history of obesity and a new diagnosis of PCOS from last visit. She has acne and hirsutism and irregular menstrual cycles. She tried OCPs in the past and recently Provera and NuvaRing. She would like to try a new OCP and I advised her to start Orthocyclen >> sent to pharmacy.  - we did discuss that she needs at least 4 menstrual cycles a year to maintain the health of her bones - since she is still struggling with her weight, we discussed about a referral to nutrition (done) and advised her to go online to [email or web address] and get suggestions about calories - she is using the phone app Myfitnesspal. - we will also try metformin again, this time as an extended release tab >> given samples and she will let me know if she wants me to send a refill to her pharmacy - we will check labs at next visit: testosterone and HbA1c as her hemoglobin A1c was 5.8% on 11/15/2012, which is at the threshold for prediabetes.  - I will see her back in 6 months  2. Obesity class III - reinforced diet  - referred to nutrition - continue exercise - Metformin should help, if she can tolerate it

## 2013-07-24 ENCOUNTER — Encounter: Payer: Self-pay | Admitting: Internal Medicine

## 2013-07-25 ENCOUNTER — Other Ambulatory Visit: Payer: Self-pay | Admitting: Internal Medicine

## 2013-07-25 DIAGNOSIS — E282 Polycystic ovarian syndrome: Secondary | ICD-10-CM

## 2013-07-25 MED ORDER — METFORMIN HCL ER (MOD) 1000 MG PO TB24: 1000.0000 mg | ORAL_TABLET | Freq: Two times a day (BID) | ORAL | Status: AC

## 2013-08-02 ENCOUNTER — Other Ambulatory Visit: Payer: Self-pay

## 2013-08-15 ENCOUNTER — Ambulatory Visit: Payer: BC Managed Care – PPO | Admitting: *Deleted

## 2013-08-20 ENCOUNTER — Encounter: Payer: Self-pay | Admitting: General Practice

## 2013-08-20 ENCOUNTER — Ambulatory Visit (INDEPENDENT_AMBULATORY_CARE_PROVIDER_SITE_OTHER): Payer: BC Managed Care – PPO | Admitting: Family Medicine

## 2013-08-20 ENCOUNTER — Encounter: Payer: Self-pay | Admitting: Family Medicine

## 2013-08-20 VITALS — BP 126/86 | HR 101 | Temp 98.0°F | Resp 16

## 2013-08-20 DIAGNOSIS — T2022XA Burn of second degree of lip(s), initial encounter: Secondary | ICD-10-CM | POA: Insufficient documentation

## 2013-08-20 MED ORDER — CIPROFLOXACIN HCL 500 MG PO TABS: 500.0000 mg | ORAL_TABLET | Freq: Two times a day (BID) | ORAL | Status: DC

## 2013-08-20 NOTE — Patient Instructions (Signed)
Follow up as needed Apply Neosporin twice daily Keep area clean and dry Start the Cipro twice daily- take w/ food Call with any questions or concerns Hang in there!!! Happy Thanksgiving!
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
## 2013-08-21 NOTE — Assessment & Plan Note (Signed)
New.  Wound was cleaned w/ H2O2, yellow crust was debrided, topical abx ointment applied.  Start oral meds to cover for superimposed infxn.  Reviewed supportive care and red flags that should prompt return.  Pt expressed understanding and is in agreement w/ plan.

## 2013-09-04 ENCOUNTER — Ambulatory Visit (INDEPENDENT_AMBULATORY_CARE_PROVIDER_SITE_OTHER): Payer: BC Managed Care – PPO | Admitting: Family Medicine

## 2013-09-04 ENCOUNTER — Ambulatory Visit: Payer: BC Managed Care – PPO

## 2013-09-04 VITALS — BP 132/70 | HR 100 | Temp 98.2°F | Resp 16 | Ht 62.0 in | Wt 262.4 lb

## 2013-09-04 DIAGNOSIS — M25571 Pain in right ankle and joints of right foot: Secondary | ICD-10-CM

## 2013-09-04 DIAGNOSIS — S93409A Sprain of unspecified ligament of unspecified ankle, initial encounter: Secondary | ICD-10-CM

## 2013-09-04 DIAGNOSIS — M79609 Pain in unspecified limb: Secondary | ICD-10-CM

## 2013-09-04 DIAGNOSIS — M25579 Pain in unspecified ankle and joints of unspecified foot: Secondary | ICD-10-CM

## 2013-09-04 MED ORDER — HYDROCODONE-ACETAMINOPHEN 5-325 MG PO TABS: 1.0000 | ORAL_TABLET | Freq: Four times a day (QID) | ORAL | Status: DC | PRN
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
## 2013-09-07 ENCOUNTER — Telehealth: Payer: Self-pay

## 2013-09-07 NOTE — Telephone Encounter (Signed)
Patient did not get her AVS on 09/04/2013; can we fax it to her? Fax number [phone]  [phone]

## 2013-09-07 NOTE — Telephone Encounter (Signed)
Faxed for her

## 2013-09-17 ENCOUNTER — Ambulatory Visit (INDEPENDENT_AMBULATORY_CARE_PROVIDER_SITE_OTHER): Payer: BC Managed Care – PPO | Admitting: Neurology

## 2013-09-17 ENCOUNTER — Encounter: Payer: Self-pay | Admitting: Neurology

## 2013-09-17 ENCOUNTER — Ambulatory Visit (INDEPENDENT_AMBULATORY_CARE_PROVIDER_SITE_OTHER): Payer: BC Managed Care – PPO

## 2013-09-17 DIAGNOSIS — R0609 Other forms of dyspnea: Secondary | ICD-10-CM

## 2013-09-17 DIAGNOSIS — R0683 Snoring: Secondary | ICD-10-CM

## 2013-09-17 DIAGNOSIS — G4713 Recurrent hypersomnia: Secondary | ICD-10-CM

## 2013-09-17 DIAGNOSIS — G2581 Restless legs syndrome: Secondary | ICD-10-CM

## 2013-09-17 DIAGNOSIS — E282 Polycystic ovarian syndrome: Secondary | ICD-10-CM

## 2013-09-17 HISTORY — DX: Restless legs syndrome: G25.81

## 2013-09-17 NOTE — Progress Notes (Addendum)
[name]  Provider:  [name], M D  Referring Provider: [name], MD Primary Care Physician:  Danielle Rhymes, MD  Chief Complaint  Patient presents with  . eds, snoring and morbid obesity    NP, paper referral, Rm 10    HPI:  Danielle Glenn is a 32 y.o. female  Is seen here as a referral/ revisit  from Dr. Beverely Glenn Dr Danielle Glenn.  She was actually referred by her dentist , Dr Danielle Glenn DDS,  at [facility]. Todays referral is for Bruxism, sleep apnea.   Mr. [name] presents today as a new patient to the sleep clinic, upon concerns she had voiced towards her dentist,  including snoring, excessive daytime sleepiness, morning and night time headaches and a narrow upper airway. The patient describes her had pain as involving the left eye and left temple radiating towards the back of the hand, rarely has her right side being affected never the right  side of the head. She does not report any conjunctival injection or tearing trembles gets stuffy results headaches. Headaches have woken her from sleep and may last up to 72 hours. She has also at times taken medication over-the-counter which abbreviated the headaches but they returned sometimes several times the same day. Imitrex will abbreviated the single headache but will not protect her from a rebound. She has nausea and at times has vomited due to  her headaches. Photophobia and phonophobia have been reported.  Onset for these headache was years ago, triggers were menstrual cycle and hormonal birth control, she had a NUVA ring removed.  The patient also is prediabetic and has polycystic ovarian syndrome. Her dentist mentioned that she has bruxism and was concerned that this was a manifestation of a sleep disorder that maybe based in sleep apnea. The patient has a BMI of over 48 and is at risk for obesity hypoventilation. She sometimes wakes up with a dull headache.   Sleep habits:  bedtime is about 23.00 hours, she has trouble to fall asleep, may take up to one hour, she will wake up frequently : headaches wake her up to 3 times, no nocturia, but she will drink water at night due to her dry mouth. He boyfriend has noted her seep walking, she will suddently jump up, sleep talking.  Snoring has been observed by her boyfriend and previously her mother, she is not sure if there is a positional component , she sleeps on her side.  She will wake up by 6 AM  ( needs an alarm) and starts to work at 7 AM. She feels un refreshed, tired. She drives to work, has a coffee in AM, she is exposed to natural daylight.  Lunch break is regular, she will leave work at 5 PM and she feels more tired, but unable to nap. She dozes off sometimes - reading , watching TV, 30 minute nap time- feels refreshed.  She doesn't drink and doesn't smoke ,  1-2 beverages with caffeine in late daytime ( Coke , coffee) . Bedroom is quiet, dark and cool.   .    Review of Systems:  Out of a complete 14 system review, the patient complains of only the following symptoms, and all other reviewed systems are negative.  The patient endorsed weight gain (unintentional) , frustration , fatigue, snoring,  excessive daytime sleepiness,  the feeling of un-refreshing and non restoring sleep, allergies with rhinitis ,  Headaches, and  Bruxism. Fatigue  severity score  was 42 points,  Epworth sleepiness score was  16  . Beck's Depression index 3 points.    History   Social History  . Marital Status: Single    Spouse Name: N/A    Number of Children: 0  . Years of Education: college   Occupational History  . [employer]   . [employer]    Social History Main Topics  . Smoking status: Light Tobacco Smoker  . Smokeless tobacco: Not on file  . Alcohol Use: Yes  . Drug Use: No  . Sexual Activity: Not on file   Other Topics Concern  . Not on file   Social History Narrative  . No narrative on file     Family History  Problem Relation Age of Onset  . Lupus Mother   . Hypothyroidism Mother   . Hypertension Mother   . Heart disease Father   . Hypertension Father   . Hypertension Brother   . Diabetes Maternal Aunt   . Diabetes Maternal Grandmother   . Heart disease Paternal Grandfather     Past Medical History  Diagnosis Date  . Migraines   . Diabetes     Past Surgical History  Procedure Laterality Date  . Tonsillectomy      Current Outpatient Prescriptions  Medication Sig Dispense Refill  . ciprofloxacin (CIPRO) 500 MG tablet Take 1 tablet (500 mg total) by mouth 2 (two) times daily.  20 tablet  0  . HYDROcodone-acetaminophen (NORCO) 5-325 MG per tablet Take 1 tablet by mouth every 6 (six) hours as needed for moderate pain.  15 tablet  0  . metFORMIN (GLUMETZA) 1000 MG (MOD) 24 hr tablet Take 1 tablet (1,000 mg total) by mouth 2 (two) times daily with a meal.  60 tablet  5  . norgestimate-ethinyl estradiol (ORTHO-CYCLEN, 28,) 0.25-35 MG-MCG tablet Take 1 tablet by mouth daily.  1 Package  11  . SUMAtriptan (IMITREX) 100 MG tablet Take 1 tablet (100 mg total) by mouth every 2 (two) hours as needed for migraine.  10 tablet  0   No current facility-administered medications for this visit.    Allergies as of 09/17/2013 - Review Complete 09/17/2013  Allergen Reaction Noted  . Penicillins Hives 12/14/2011    Vitals: BP 128/82  Pulse 102  Resp 18  Ht 5\' 2"  (1.575 m)  Wt 264 lb (119.75 kg)  BMI 48.27 kg/m2  LMP 09/14/2013 Last Weight:  Wt Readings from Last 1 Encounters:  09/17/13 264 lb (119.75 kg)   Last Height:   Ht Readings from Last 1 Encounters:  09/17/13 5\' 2"  (1.575 m)    Physical exam:  General: The patient is awake, alert and appears not in acute distress. The patient is well groomed. Head: Normocephalic, atraumatic. Neck is supple. Mallampati 3 , neck circumference: 16 , nasal passage is unrestricted,   Retrognathia.  TMJ from bruxism.   Cardiovascular:  Regular rate and rhythm, without  murmurs or carotid bruit, and without distended neck veins. Respiratory: Lungs are clear to auscultation. Skin:  Without evidence of edema, or rash Trunk: BMI is elevated, patient has normal posture.  Neurologic exam : The patient is awake and alert, oriented to place and time.  Memory subjective described as intact. There is a normal attention span & concentration ability.  Speech is fluent without  dysarthria, dysphonia or aphasia. Mood and affect are appropriate.  Cranial nerves: Pupils are equal and briskly reactive to light. Funduscopic exam without evidence of pallor or  edema.  Extraocular movements  in vertical and horizontal planes intact and without nystagmus. Visual fields by finger perimetry are intact. Hearing to finger rub intact.  Facial sensation intact to fine touch. Facial motor strength is symmetric and tongue and uvula move midline.  Motor exam:  Normal tone and normal muscle bulk and symmetric normal strength in all extremities.  Sensory:  Fine touch, pinprick and vibration were tested in all extremities. Proprioception is  Normal.  Patient reports burning and pin and needles at times in both legs. Not in hands, bottom of feet,medial (mostly) ankle , creepy crawly  .   Coordination: Rapid alternating movements in the fingers/hands is tested and normal. Finger-to-nose maneuver tested and normal without evidence of ataxia, dysmetria or tremor.  Gait and station: Patient walks without assistive device . Strength within normal limits. Stance is stable and normal. Tandem gait is unfragmented. Romberg testing is negative .  Deep tendon reflexes: in the  upper and lower extremities are symmetric and intact. Babinski maneuver response is downgoing.   Assessment:  After physical and neurologic examination, review of laboratory studies, imaging, neurophysiology testing and pre-existing records, assessment is  1) RLS and  beginning diabetic  sensory neuropathy. PCOS :dysmenorrheic , and may cause iron deficiency anemia.  2) likely OSA , based on risk factors of Mallampati, neck circumference and retrognathia, BMI.  Clinical symptoms include snoring and EDS.   Plan:  Treatment plan and additional workup : SPLIT study , Ferritin , TIBC  and based on results, may use dental device or CPAP if apnea is found.

## 2013-09-17 NOTE — Patient Instructions (Signed)
Obesity Obesity is having too much body fat and a body mass index (BMI) of 30 or more. BMI is a number based on your height and weight. The number is an estimate of how much body fat you have. Obesity can happen if you eat more calories than you can burn by exercising or other activity. It can cause major health problems or emergencies.  HOME CARE  Exercise and be active as told by your doctor. Try:  Using stairs when you can.  Parking farther away from store doors.  Gardening, biking, or walking.  Eat healthy foods and drinks that are low in calories. Eat more fruits and vegetables.  Limit fast food, sweets, and snack foods that are made with ingredients that are not natural (processed food).  Eat smaller amounts of food.  Keep a journal and write down what you eat every day. Websites can help with this.  Avoid drinking alcohol. Drink more water and drinks without calories.   Take vitamins and dietary pills (supplements) only as told by your doctor.  Try going to weight-loss support groups or classes to help lessen stress. Dieticians and counselors may also help. GET HELP RIGHT AWAY IF:  You have chest pain or tightness.  You have trouble breathing or feel short of breath.  You feel weak or have loss of feeling (numbness) in your legs.  You feel confused or have trouble talking.  You have sudden changes in your vision. MAKE SURE YOU:  Understand these instructions.  Will watch your condition.  Will get help right away if you are not doing well or get worse. Document Released: 12/06/2011 Document Reviewed: 12/06/2011 Exit[employer] Patient Information 2014 [employer]. Sleep Apnea Sleep apnea is disorder that affects a person's sleep. A person with sleep apnea has abnormal pauses in their breathing when they sleep. It is hard for them to get a good sleep. This makes a person tired during the day. It also can lead to other physical problems. There are three types of sleep  apnea. One type is when breathing stops for a short time because your airway is blocked (obstructive sleep apnea). Another type is when the brain sometimes fails to give the normal signal to breathe to the muscles that control your breathing (central sleep apnea). The third type is a combination of the other two types. HOME CARE  Do not sleep on your back. Try to sleep on your side.  Take all medicine as told by your doctor.  Avoid alcohol, calming medicines (sedatives), and depressant drugs.  Try to lose weight if you are overweight. Talk to your doctor about a healthy weight goal. Your doctor may have you use a device that helps to open your airway. It can help you get the air that you need. It is called a positive airway pressure (PAP) device. There are three types of PAP devices:  Continuous positive airway pressure (CPAP) device.  Nasal expiratory positive airway pressure (EPAP) device.  Bilevel positive airway pressure (BPAP) device. MAKE SURE YOU:  Understand these instructions.  Will watch your condition.  Will get help right away if you are not doing well or get worse. Document Released: 06/22/2008 Document Revised: 08/30/2012 Document Reviewed: 01/15/2012 ExitCare Patient Information 2014 [employer].  

## 2013-09-18 LAB — IRON AND TIBC: Iron Saturation: 11 % — ABNORMAL LOW (ref 15–55)

## 2013-09-25 ENCOUNTER — Other Ambulatory Visit: Payer: Self-pay | Admitting: *Deleted

## 2013-09-25 ENCOUNTER — Telehealth: Payer: Self-pay | Admitting: Neurology

## 2013-09-25 DIAGNOSIS — G4713 Recurrent hypersomnia: Secondary | ICD-10-CM

## 2013-09-25 NOTE — Telephone Encounter (Signed)
I called and left a message for the patient about her sleep study results. I informed the patient that the showed no evidence of significant central or obstructive sleep apnea and it did not explain the high degree of sleepiness reflected in the Epworth score of 17 points. Dr. Vickey Huger recommending another in lab study NPSG/MSLT (night & day study). I will fax a copy of the report to Dr. Natalia Leatherwood Tabori's office and mail a copy to the patient.

## 2013-10-01 ENCOUNTER — Encounter: Payer: Self-pay | Admitting: *Deleted

## 2013-10-05 NOTE — Progress Notes (Signed)
Quick Note:  I called patient and relayed the information from Dr. Vickey Hugerohmeier. Patient understands to take an iron supplement or a prenatal vitamin for low iron level. Do not take a regular multi-vitamin because the prenatal and the iron supplements each have more iron in them. ______

## 2013-11-23 ENCOUNTER — Telehealth: Payer: Self-pay | Admitting: *Deleted

## 2013-11-23 NOTE — Telephone Encounter (Signed)
I completed this order.  Unclear as to what happened to it after it's completion and (hopefully) faxing, but I would recommend looking in the 'to be scanned pile'.  If it is not there, please have the dentist fax another order for me to complete ASAP.

## 2013-11-23 NOTE — Telephone Encounter (Signed)
Mallory from [facility] called regarding an order that was faxed over on 10/22/2013.The order is an oral appliances for sleep apnea and they have yet to receive the order back.    Contact [phone].

## 2013-11-23 NOTE — Telephone Encounter (Signed)
[address] [phone]) spoke with Eber Jonesarolyn who stated that all orders for oral appliances go through the office in [place] # is [phone]. Called the office in [place], they are going to refax order for oral appliance to our office.

## 2013-11-26 NOTE — Telephone Encounter (Signed)
This paperwork was received by Tabori on 11/26/13. This paperwork was faxed back to the office on 11/26/13 @ 2:31pm. Papers are still on our fax machine side a incase I need to resend.

## 2014-01-11 ENCOUNTER — Ambulatory Visit: Payer: BC Managed Care – PPO | Admitting: Internal Medicine

## 2014-06-11 ENCOUNTER — Ambulatory Visit (INDEPENDENT_AMBULATORY_CARE_PROVIDER_SITE_OTHER): Payer: Self-pay | Admitting: Family Medicine

## 2014-06-11 VITALS — BP 130/82 | HR 96 | Temp 98.8°F | Resp 18 | Ht 62.5 in | Wt 257.0 lb

## 2014-06-11 DIAGNOSIS — Z23 Encounter for immunization: Secondary | ICD-10-CM

## 2014-06-16 NOTE — Progress Notes (Signed)
Need for prophylactic vaccination with combined diphtheria-tetanus-pertussis (DTP) vaccine Pt does not remember when last tdap was - needs it right now for school.  Norberto Sorenson, MD MPH

## 2014-06-29 ENCOUNTER — Encounter (HOSPITAL_COMMUNITY): Payer: Self-pay | Admitting: Emergency Medicine

## 2014-06-29 ENCOUNTER — Emergency Department (HOSPITAL_COMMUNITY)
Admission: EM | Admit: 2014-06-29 | Discharge: 2014-06-29 | Disposition: A | Discharge status: Home / Self Care | Payer: BC Managed Care – PPO | Attending: Emergency Medicine | Admitting: Emergency Medicine

## 2014-06-29 ENCOUNTER — Emergency Department (HOSPITAL_COMMUNITY): Payer: BC Managed Care – PPO

## 2014-06-29 DIAGNOSIS — Z720 Tobacco use: Secondary | ICD-10-CM | POA: Insufficient documentation

## 2014-06-29 DIAGNOSIS — Z880 Allergy status to penicillin: Secondary | ICD-10-CM | POA: Insufficient documentation

## 2014-06-29 DIAGNOSIS — Z8669 Personal history of other diseases of the nervous system and sense organs: Secondary | ICD-10-CM | POA: Insufficient documentation

## 2014-06-29 DIAGNOSIS — Z8679 Personal history of other diseases of the circulatory system: Secondary | ICD-10-CM | POA: Insufficient documentation

## 2014-06-29 DIAGNOSIS — R079 Chest pain, unspecified: Secondary | ICD-10-CM | POA: Insufficient documentation

## 2014-06-29 DIAGNOSIS — Z7982 Long term (current) use of aspirin: Secondary | ICD-10-CM | POA: Insufficient documentation

## 2014-06-29 DIAGNOSIS — E119 Type 2 diabetes mellitus without complications: Secondary | ICD-10-CM | POA: Insufficient documentation

## 2014-06-29 HISTORY — DX: Polycystic ovarian syndrome: E28.2

## 2014-06-29 LAB — CBC WITH DIFFERENTIAL/PLATELET
BASOS PCT: 0 % (ref 0–1)
Basophils Absolute: 0 10*3/uL (ref 0.0–0.1)
Eosinophils Absolute: 0.1 10*3/uL (ref 0.0–0.7)
Eosinophils Relative: 1 % (ref 0–5)
HEMATOCRIT: 39.2 % (ref 36.0–46.0)
HEMOGLOBIN: 13.3 g/dL (ref 12.0–15.0)
LYMPHS ABS: 3.9 10*3/uL (ref 0.7–4.0)
Lymphocytes Relative: 36 % (ref 12–46)
MCH: 28.7 pg (ref 26.0–34.0)
MCHC: 33.9 g/dL (ref 30.0–36.0)
MCV: 84.5 fL (ref 78.0–100.0)
MONOS PCT: 5 % (ref 3–12)
Monocytes Absolute: 0.6 10*3/uL (ref 0.1–1.0)
NEUTROS ABS: 6.2 10*3/uL (ref 1.7–7.7)
Neutrophils Relative %: 58 % (ref 43–77)
Platelets: 393 10*3/uL (ref 150–400)
RBC: 4.64 MIL/uL (ref 3.87–5.11)
RDW: 14.2 % (ref 11.5–15.5)
WBC: 10.7 10*3/uL — ABNORMAL HIGH (ref 4.0–10.5)

## 2014-06-29 LAB — COMPREHENSIVE METABOLIC PANEL
ALBUMIN: 3.6 g/dL (ref 3.5–5.2)
ALK PHOS: 80 U/L (ref 39–117)
ALT: 15 U/L (ref 0–35)
ANION GAP: 12 (ref 5–15)
AST: 13 U/L (ref 0–37)
BUN: 11 mg/dL (ref 6–23)
CALCIUM: 9.3 mg/dL (ref 8.4–10.5)
CO2: 24 mEq/L (ref 19–32)
Chloride: 105 mEq/L (ref 96–112)
Creatinine, Ser: 0.69 mg/dL (ref 0.50–1.10)
GFR calc non Af Amer: >90 mL/min (ref >90)
Glucose, Bld: 90 mg/dL (ref 70–99)
POTASSIUM: 3.9 meq/L (ref 3.7–5.3)
Sodium: 141 mEq/L (ref 137–147)
TOTAL PROTEIN: 8.3 g/dL (ref 6.0–8.3)
Total Bilirubin: 0.2 mg/dL — ABNORMAL LOW (ref 0.3–1.2)

## 2014-06-29 LAB — I-STAT TROPONIN, ED: Troponin i, poc: 0 ng/mL (ref 0.00–0.08)

## 2014-06-29 LAB — D-DIMER, QUANTITATIVE (NOT AT ARMC)

## 2014-06-29 LAB — LIPASE, BLOOD: Lipase: 23 U/L (ref 11–59)

## 2014-06-29 NOTE — ED Notes (Signed)
Pt states that she has been having lt arm pain and bilateral hand tingling.  Pt states that she had her roommate check her BP and it was 160/100.  Pt decided to come in because pt's brother was having the same sx and died in his sleep (this happened 2 weeks ago).  Pt's brother was 458 years old.

## 2014-06-29 NOTE — Discharge Instructions (Signed)
Chest Pain (Nonspecific) It is often hard to give a diagnosis for the cause of chest pain. There is always a chance that your pain could be related to something serious, such as a heart attack or a blood clot in the lungs. You need to follow up with your doctor. HOME CARE  If antibiotic medicine was given, take it as directed by your doctor. Finish the medicine even if you start to feel better.  For the next few days, avoid activities that bring on chest pain. Continue physical activities as told by your doctor.  Do not use any tobacco products. This includes cigarettes, chewing tobacco, and e-cigarettes.  Avoid drinking alcohol.  Only take medicine as told by your doctor.  Follow your doctor's suggestions for more testing if your chest pain does not go away.  Keep all doctor visits you made. GET HELP IF:  Your chest pain does not go away, even after treatment.  You have a rash with blisters on your chest.  You have a fever. GET HELP RIGHT AWAY IF:   You have more pain or pain that spreads to your arm, neck, jaw, back, or belly (abdomen).  You have shortness of breath.  You cough more than usual or cough up blood.  You have very bad back or belly pain.  You feel sick to your stomach (nauseous) or throw up (vomit).  You have very bad weakness.  You pass out (faint).  You have chills. This is an emergency. Do not wait to see if the problems will go away. Call your local emergency services (911 in [place] Do not drive yourself to the hospital. MAKE SURE YOU:   Understand these instructions.  Will watch your condition.  Will get help right away if you are not doing well or get worse. Document Released: 03/01/2008 Document Revised: 09/18/2013 Document Reviewed: 03/01/2008 [facility] Patient Information 2015 [employer] [place]. This information is not intended to replace advice given to you by your health care provider. Make sure you discuss any questions you have with your  health care provider.  TAKE A DAILY BABY ASPIRIN. DO NOT SMOKE. EAT A HEALTHY, LOW FAT DIET.

## 2014-06-29 NOTE — ED Notes (Signed)
Patient transported to X-ray 

## 2014-06-29 NOTE — ED Provider Notes (Signed)
CSN: [id number]     Arrival date & time 06/29/14  1255 History   First MD Initiated Contact with Patient 06/29/14 1322     Chief Complaint  Patient presents with  . Hypertension     (Consider location/radiation/quality/duration/timing/severity/associated sxs/prior Treatment) HPI Sore feeling left anterior, upper chest and left arm, onset yesterday. Brother died 2 weeks ago, suddenly from heart attack. Had had similar symptoms for awhile preceding his death. Patient no personal history of HTN. Taking a lot of deep breaths this morning but denies feeling SOB. No light headedness or near syncope. Having intermittent head aches, coming and going history of migraines.  Past Medical History  Diagnosis Date  . Migraines   . Diabetes   . RLS (restless legs syndrome) 09/17/2013  . PCOS (polycystic ovarian syndrome)    Past Surgical History  Procedure Laterality Date  . Tonsillectomy     Family History  Problem Relation Age of Onset  . Lupus Mother   . Hypothyroidism Mother   . Hypertension Mother   . Heart disease Father   . Hypertension Father   . Hypertension Brother   . Diabetes Maternal Aunt   . Diabetes Maternal Grandmother   . Heart disease Paternal Grandfather    History  Substance Use Topics  . Smoking status: Light Tobacco Smoker  . Smokeless tobacco: Not on file  . Alcohol Use: Yes   OB History   Grav Para Term Preterm Abortions TAB SAB Ect Mult Living                 Review of Systems  10 Systems reviewed and are negative for acute change except as noted in the HPI.  Allergies  Penicillins  Home Medications   Prior to Admission medications   Medication Sig Start Date End Date Taking? Authorizing Provider  aspirin 325 MG tablet Take 650 mg by mouth daily.   Yes Historical Provider, MD  ibuprofen (ADVIL,MOTRIN) 200 MG tablet Take 200 mg by mouth every 6 (six) hours as needed for headache or mild pain.   Yes Historical Provider, MD  metFORMIN (GLUMETZA) 1000  MG (MOD) 24 hr tablet Take 1 tablet (1,000 mg total) by mouth 2 (two) times daily with a meal. 07/25/13  Yes Carlus Pavlov, MD   BP 143/87  Pulse 68  Temp(Src) 97.9 F (36.6 C) (Oral)  Resp 20  SpO2 100% Physical Exam  Constitutional: She is oriented to person, place, and time. She appears well-developed and well-nourished.  HENT:  Head: Normocephalic and atraumatic.  Eyes: EOM are normal. Pupils are equal, round, and reactive to light.  Neck: Neck supple.  Cardiovascular: Normal rate, regular rhythm, normal heart sounds and intact distal pulses.   Pulmonary/Chest: Effort normal and breath sounds normal.  Abdominal: Soft. Bowel sounds are normal. She exhibits no distension. There is no tenderness.  Musculoskeletal: Normal range of motion. She exhibits no edema.  Neurological: She is alert and oriented to person, place, and time. She has normal strength. Coordination normal. GCS eye subscore is 4. GCS verbal subscore is 5. GCS motor subscore is 6.  Skin: Skin is warm, dry and intact.  Psychiatric: She has a normal mood and affect.    ED Course  Procedures (including critical care time) Labs Review Labs Reviewed  COMPREHENSIVE METABOLIC PANEL - Abnormal; Notable for the following:    Total Bilirubin 0.2 (*)    All other components within normal limits  CBC WITH DIFFERENTIAL - Abnormal; Notable for the following:  WBC 10.7 (*)    All other components within normal limits  LIPASE, BLOOD  D-DIMER, QUANTITATIVE  I-STAT TROPOININ, ED    Imaging Review Dg Chest 2 View  06/29/2014   CLINICAL DATA:  HYPERTENSION, Left side chest pain with tingling down left arm since last night. Occasional smoker.  EXAM: CHEST - 2 VIEW  COMPARISON:  None available  FINDINGS: Lungs are clear. Heart size and mediastinal contours are within normal limits. No effusion.  No pneumothorax. Visualized skeletal structures are unremarkable.  IMPRESSION: No acute cardiopulmonary disease.   Electronically  Signed   By: Oley Balmaniel  Hassell M.D.   On: 06/29/2014 14:41     EKG Interpretation   Date/Time:  Saturday June 29 2014 14:38:29 EDT Ventricular Rate:  65 PR Interval:  154 QRS Duration: 68 QT Interval:  416 QTC Calculation: 432 R Axis:   18 Text Interpretation:  Sinus rhythm normal  Confirmed by Donnald GarrePfeiffer, MD,  Lebron ConnersMarcy [phone]([id number]) on 06/29/2014 3:26:34 PM      MDM   Final diagnoses:  Chest pain, unspecified chest pain type   The patient presents as outlined above. She does not have any significant hypertension here in the emergency department, she also does not have a personal history of hypertension. Diagnostic workup has been negative. At this point time the main concern is the patient's family history, with a brother who had apparent sudden death. Due to this I will have the patient followup with cardiology for further testing and potential stress testing if they determined as appropriate for her. I will have her continue to take a daily baby aspirin and counsel for physical conditioning and avoidance of tobacco use.    Arby BarretteMarcy Kassidi Elza, MD 06/29/14 1534

## 2014-07-12 ENCOUNTER — Other Ambulatory Visit: Payer: Self-pay

## 2014-10-30 ENCOUNTER — Telehealth: Payer: Self-pay | Admitting: Family Medicine

## 2014-10-30 ENCOUNTER — Ambulatory Visit (INDEPENDENT_AMBULATORY_CARE_PROVIDER_SITE_OTHER): Payer: BLUE CROSS/BLUE SHIELD | Admitting: Family Medicine

## 2014-10-30 ENCOUNTER — Encounter: Payer: Self-pay | Admitting: Family Medicine

## 2014-10-30 VITALS — BP 136/82 | HR 87 | Temp 98.2°F | Resp 16 | Wt 263.0 lb

## 2014-10-30 DIAGNOSIS — M62830 Muscle spasm of back: Secondary | ICD-10-CM

## 2014-10-30 DIAGNOSIS — R079 Chest pain, unspecified: Secondary | ICD-10-CM | POA: Insufficient documentation

## 2014-10-30 DIAGNOSIS — R0789 Other chest pain: Secondary | ICD-10-CM

## 2014-10-30 DIAGNOSIS — F4323 Adjustment disorder with mixed anxiety and depressed mood: Secondary | ICD-10-CM

## 2014-10-30 MED ORDER — CYCLOBENZAPRINE HCL 10 MG PO TABS: 10.0000 mg | ORAL_TABLET | Freq: Three times a day (TID) | ORAL | Status: DC | PRN

## 2014-10-30 MED ORDER — ALPRAZOLAM 0.5 MG PO TABS: 0.5000 mg | ORAL_TABLET | Freq: Three times a day (TID) | ORAL | Status: AC | PRN

## 2014-10-30 MED ORDER — CITALOPRAM HYDROBROMIDE 20 MG PO TABS: 20.0000 mg | ORAL_TABLET | Freq: Every day | ORAL | Status: AC

## 2014-10-30 NOTE — Patient Instructions (Signed)
Follow up in 1 month to recheck mood and pain We'll call you with your Cardiology appt Start the Celexa daily- start w/ 1/2 tab x2 weeks and then increase to 1 tab daily Start the Alprazolam only as needed for those panicked moments Call and schedule an appt w/ Terri or Raynelle FanningJulie- they will help you through this Call with any questions or concerns YOU CAN DO THIS!!  You are so much stronger than you give yourself credit for!

## 2014-10-30 NOTE — Progress Notes (Signed)
Pre visit review using our clinic review tool, if applicable. No additional management support is needed unless otherwise documented below in the visit note. 

## 2014-10-30 NOTE — Progress Notes (Signed)
   Subjective:    Patient ID: Danielle Glenn, female    DOB: 01/19/1981, 34 y.o.   MRN: [id number]  HPI ER f/u- [place] 2 weeks ago for L sided CP radiating under arm, down L arm, radiating to back.  [facility]  Pt reports that both ER visits were negative for cardiac etiology.  Pt was dx'd w/ Pleurisy and sent home w/ Motrin and Tramadol.  Not currently having CP, denies associated SOB, N/V.  Currently having back pain radiating into L buttock and down leg.  Pt has had high stress levels- brother died of MI 9/15 very unexpectedly.  Mom had multiple MIs this fall and pt has been at parent's home as primary caregiver.  Pt having racing heart, poor sleep, 'breakdowns', increased emotional eating.   Review of Systems For ROS see HPI   Reviewed meds, allergies, problem list, and PMH in chart     Objective:   Physical Exam  Constitutional: She is oriented to person, place, and time. She appears well-developed and well-nourished. No distress.  obese  HENT:  Head: Normocephalic and atraumatic.  Eyes: Conjunctivae and EOM are normal. Pupils are equal, round, and reactive to light.  Neck: Normal range of motion. Neck supple. No thyromegaly present.  Cardiovascular: Normal rate, regular rhythm, normal heart sounds and intact distal pulses.   No murmur heard. Pulmonary/Chest: Effort normal and breath sounds normal. No respiratory distress.  Abdominal: Soft. She exhibits no distension. There is no tenderness.  Musculoskeletal: She exhibits no edema.  Lymphadenopathy:    She has no cervical adenopathy.  Neurological: She is alert and oriented to person, place, and time.  Skin: Skin is warm and dry.  Psychiatric: Her behavior is normal.  Tearful, anxious  Vitals reviewed.         Assessment & Plan:

## 2014-10-30 NOTE — Telephone Encounter (Signed)
emmi emailed °

## 2014-11-05 NOTE — Assessment & Plan Note (Signed)
New.  Pt had negative ER w/u x 2 but has strong family hx of CAD- including recent premature death of brother.  Refer to cards for evaluation and risk stratification.  Will follow.

## 2014-11-05 NOTE — Assessment & Plan Note (Signed)
New.  Suspect this is tension related.  Start flexeril which will also improve pt's sleep.  Will follow.

## 2014-11-05 NOTE — Assessment & Plan Note (Signed)
New.  Pt had traumatic event w/ sudden loss of brother.  Has been dealing w/ her grief while attempting to be primary caregiver for parents and their health issues.  Pt has not had time to grieve or deal w/ her emotions.  Encouraged her to start counseling.  Start low dose SSRI.  Suspect that this is playing a role in pt's CP.  Will follow closely.

## 2014-11-12 ENCOUNTER — Ambulatory Visit: Payer: Self-pay | Admitting: Internal Medicine

## 2014-11-12 ENCOUNTER — Telehealth: Payer: Self-pay | Admitting: Internal Medicine

## 2014-11-12 NOTE — Telephone Encounter (Signed)
Within 3 mo

## 2014-11-12 NOTE — Telephone Encounter (Signed)
Patient no showed today's appt. Please advise on how to follow up. °A. No follow up necessary. °B. Follow up urgent. Contact patient immediately. °C. Follow up necessary. Contact patient and schedule visit in ___ days. °D. Follow up advised. Contact patient and schedule visit in ____weeks. ° °

## 2014-11-22 ENCOUNTER — Ambulatory Visit: Payer: BLUE CROSS/BLUE SHIELD | Admitting: Cardiology

## 2014-11-26 ENCOUNTER — Telehealth: Payer: Self-pay | Admitting: Family Medicine

## 2014-11-26 NOTE — Telephone Encounter (Signed)
Caller name: sharon from [facility] Relation to pt: Call back number: Pharmacy:  Reason for call:   [facility] no showed to her appointment on 11/22/14. Jasmine DecemberSharon is closing the referral.

## 2014-11-26 NOTE — Telephone Encounter (Signed)
Fyi.

## 2014-11-28 ENCOUNTER — Ambulatory Visit: Payer: BLUE CROSS/BLUE SHIELD | Admitting: Family Medicine

## 2014-12-03 ENCOUNTER — Encounter: Payer: Self-pay | Admitting: Cardiology

## 2014-12-06 ENCOUNTER — Ambulatory Visit: Payer: BLUE CROSS/BLUE SHIELD | Admitting: Family Medicine

## 2014-12-10 ENCOUNTER — Encounter: Payer: Self-pay | Admitting: Family Medicine

## 2014-12-10 ENCOUNTER — Telehealth: Payer: Self-pay | Admitting: Family Medicine

## 2014-12-10 NOTE — Telephone Encounter (Signed)
Charge request sent °

## 2014-12-10 NOTE — Telephone Encounter (Signed)
PT was no show for appt on 12/06/14. Letter sent - charge no show?

## 2014-12-10 NOTE — Telephone Encounter (Signed)
Yes, no show fee

## 2015-01-04 ENCOUNTER — Ambulatory Visit (INDEPENDENT_AMBULATORY_CARE_PROVIDER_SITE_OTHER): Payer: BLUE CROSS/BLUE SHIELD | Admitting: Physician Assistant

## 2015-01-04 VITALS — BP 120/80 | HR 95 | Temp 97.9°F | Resp 18 | Ht 61.5 in | Wt 264.6 lb

## 2015-01-04 DIAGNOSIS — R109 Unspecified abdominal pain: Secondary | ICD-10-CM

## 2015-01-04 DIAGNOSIS — R0789 Other chest pain: Secondary | ICD-10-CM | POA: Diagnosis not present

## 2015-01-04 LAB — POCT CBC
GRANULOCYTE PERCENT: 65.3 % (ref 37–80)
HEMATOCRIT: 38.6 % (ref 37.7–47.9)
HEMOGLOBIN: 12.9 g/dL (ref 12.2–16.2)
LYMPH, POC: 3.7 — AB (ref 0.6–3.4)
MCH: 27.9 pg (ref 27–31.2)
MCHC: 33.5 g/dL (ref 31.8–35.4)
MCV: 83.3 fL (ref 80–97)
MID (CBC): 0.9 (ref 0–0.9)
MPV: 6.4 fL (ref 0–99.8)
POC GRANULOCYTE: 8.6 — AB (ref 2–6.9)
POC LYMPH %: 28.1 % (ref 10–50)
POC MID %: 6.6 %M (ref 0–12)
Platelet Count, POC: 432 10*3/uL — AB (ref 142–424)
RBC: 4.63 M/uL (ref 4.04–5.48)
RDW, POC: 15.3 %
WBC: 13.1 10*3/uL — AB (ref 4.6–10.2)

## 2015-01-04 LAB — POCT UA - MICROSCOPIC ONLY
Bacteria, U Microscopic: NEGATIVE
CASTS, UR, LPF, POC: NEGATIVE
Crystals, Ur, HPF, POC: NEGATIVE
Mucus, UA: NEGATIVE
YEAST UA: NEGATIVE

## 2015-01-04 LAB — POCT URINALYSIS DIPSTICK
BILIRUBIN UA: NEGATIVE
GLUCOSE UA: NEGATIVE
Ketones, UA: NEGATIVE
Nitrite, UA: NEGATIVE
PH UA: 5.5
Spec Grav, UA: >=1.03
Urobilinogen, UA: 0.2

## 2015-01-04 MED ORDER — CIPROFLOXACIN HCL 500 MG PO TABS: 500.0000 mg | ORAL_TABLET | Freq: Two times a day (BID) | ORAL | Status: AC

## 2015-01-04 MED ORDER — CYCLOBENZAPRINE HCL 10 MG PO TABS: 10.0000 mg | ORAL_TABLET | Freq: Three times a day (TID) | ORAL | Status: AC | PRN

## 2015-01-04 MED ORDER — HYDROCODONE-ACETAMINOPHEN 5-325 MG PO TABS: 1.0000 | ORAL_TABLET | Freq: Four times a day (QID) | ORAL | Status: AC | PRN

## 2015-01-04 NOTE — Patient Instructions (Addendum)
Your urine showed white blood cells and your blood white cell count was elevated today. You may have a urinary tract infection that has moved to your kidney. Please take the cipro twice daily for 10 days.  We drew labs to check your liver and gallbladder and I'll be in touch with you tomorrow with those results.  You also had pain over the muscles in between the ribs. This may also be contributing to your pain. Please take the flexeril every 8 hours needed. Please take the norco as needed.  We have sent your urine for a culture to make sure you're on the right antibiotic. If you're not improved in 4-5 days, start having fevers or chills, start having trouble urinating, or feel that the pain is worse please come back to be seen.
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
## 2015-01-05 LAB — COMPREHENSIVE METABOLIC PANEL
ALK PHOS: 70 U/L (ref 39–117)
ALT: 19 U/L (ref 0–35)
AST: 15 U/L (ref 0–37)
Albumin: 3.9 g/dL (ref 3.5–5.2)
BUN: 13 mg/dL (ref 6–23)
CALCIUM: 9.1 mg/dL (ref 8.4–10.5)
CO2: 26 meq/L (ref 19–32)
Chloride: 104 mEq/L (ref 96–112)
Creat: 0.67 mg/dL (ref 0.50–1.10)
Glucose, Bld: 78 mg/dL (ref 70–99)
Potassium: 4.3 mEq/L (ref 3.5–5.3)
Sodium: 139 mEq/L (ref 135–145)
TOTAL PROTEIN: 7.9 g/dL (ref 6.0–8.3)
Total Bilirubin: 0.3 mg/dL (ref 0.2–1.2)

## 2015-01-05 LAB — URINE CULTURE: Colony Count: 45000

## 2015-07-20 IMAGING — CR DG ANKLE COMPLETE 3+V*R*
2 series · 2 of 2 positions shown · non-contrast
Comparison: None.

CLINICAL DATA: Right ankle pain.

EXAM:
RIGHT ANKLE - COMPLETE 3+ VIEW

[AP]
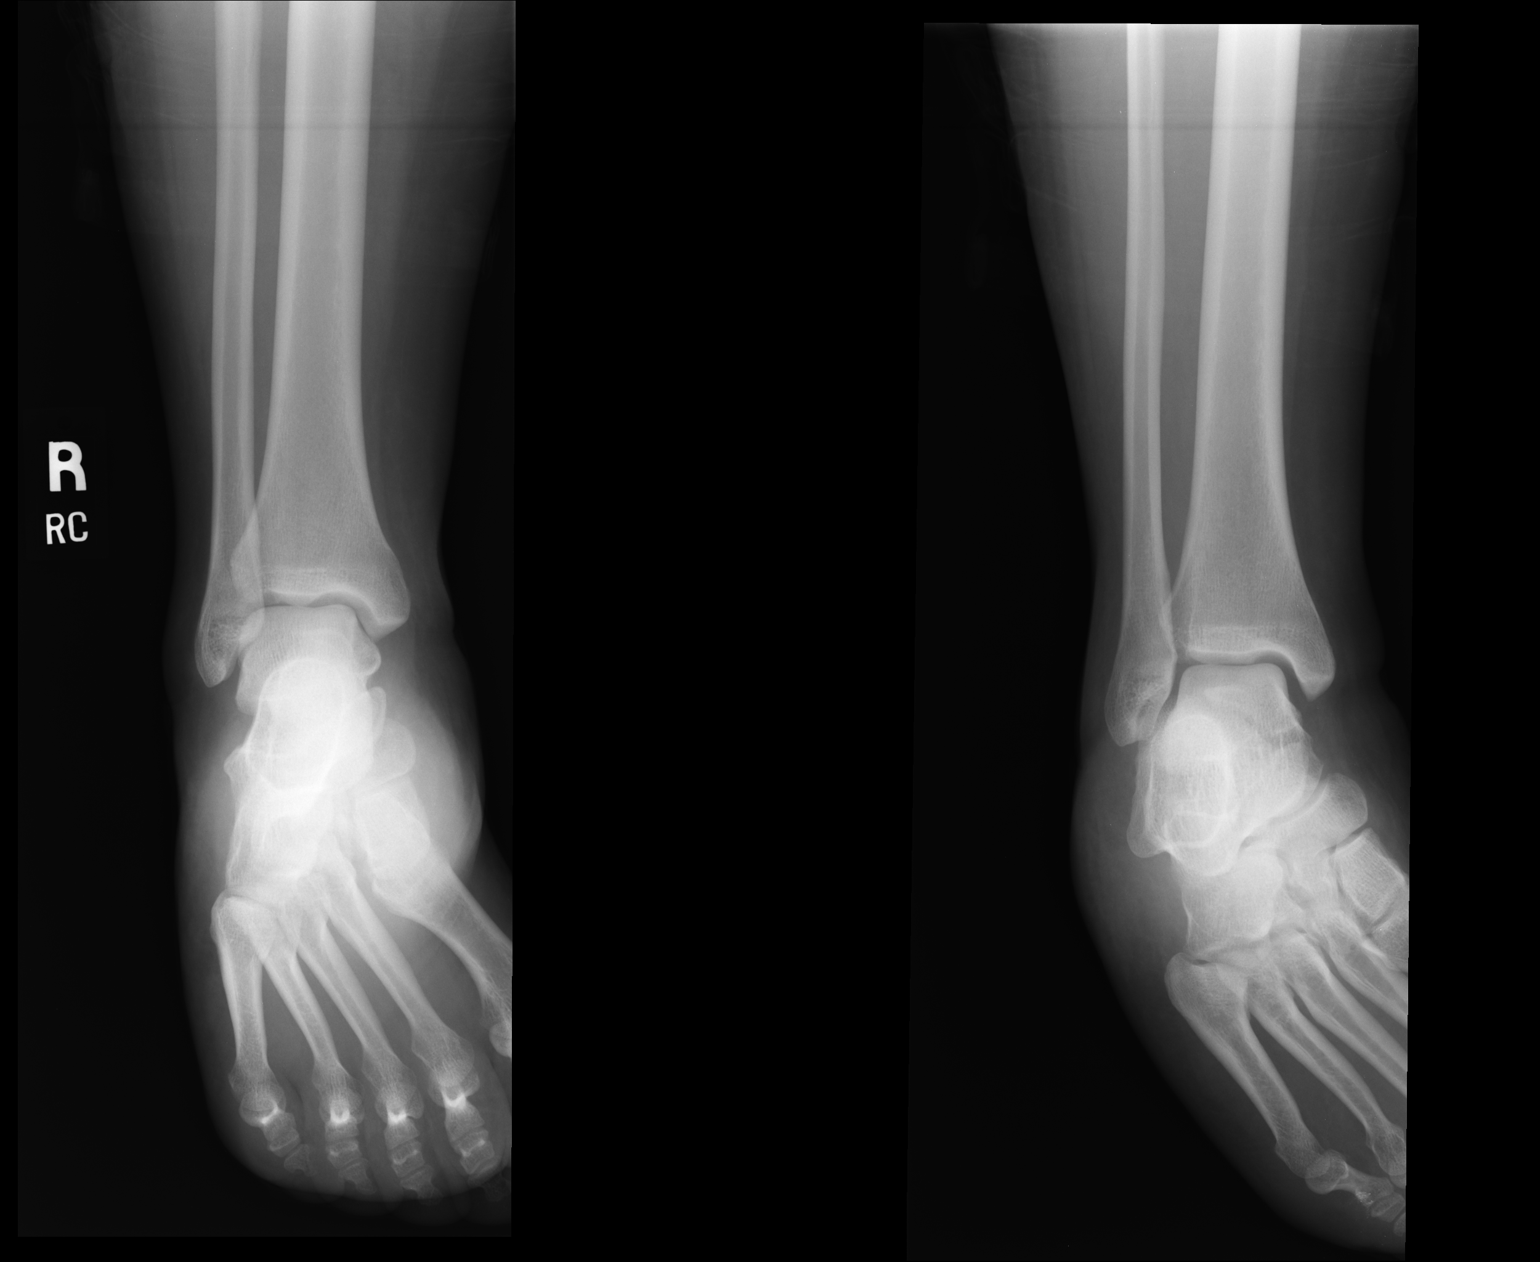

[ap obl int rot]
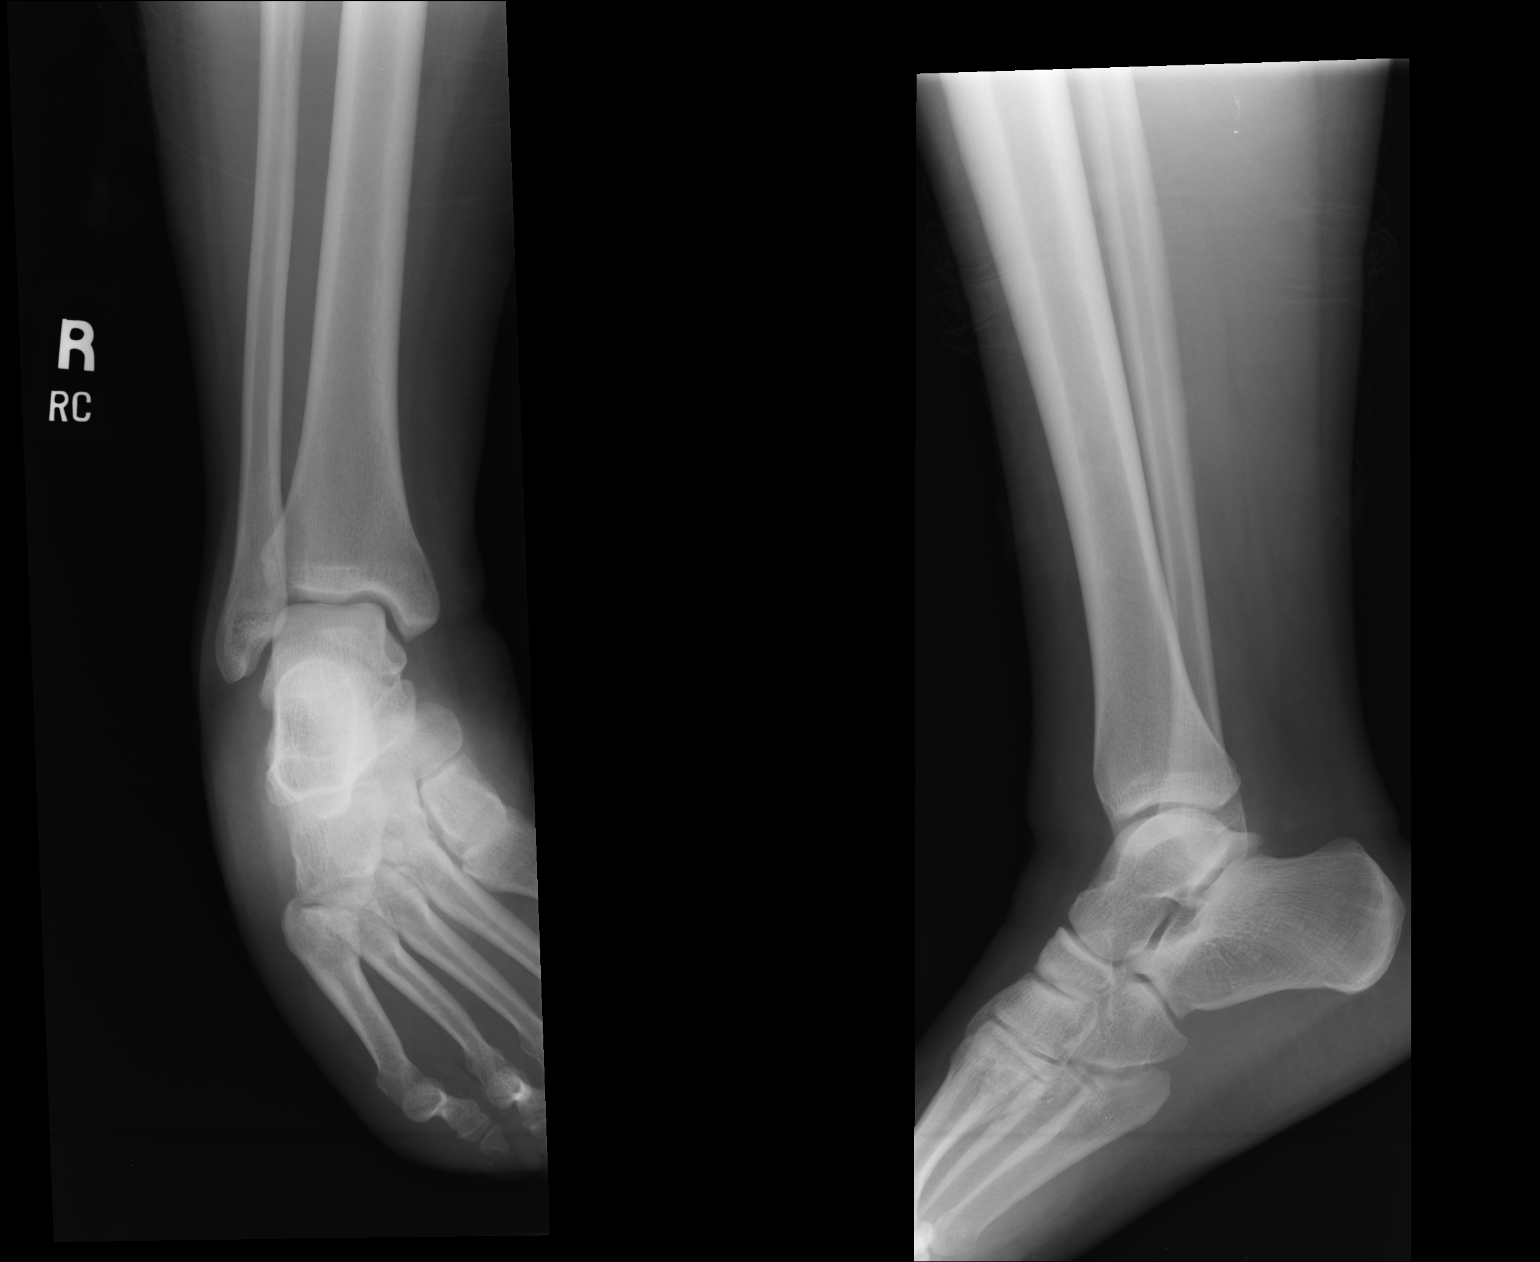

[2 of 2 positions shown; findings below may reference images not displayed]

FINDINGS: There is no evidence of fracture, dislocation, or joint effusion.
There is no evidence of arthropathy or other focal bone abnormality.
Soft tissues are unremarkable.
IMPRESSION: Negative exam.
